# Patient Record
Sex: Female | Born: 2000
Health system: Southern US, Community
[De-identification: ages and names within clinical notes are randomized; demographics above are authoritative.]

## PROBLEM LIST (undated history)

## (undated) DIAGNOSIS — R569 Unspecified convulsions: Secondary | ICD-10-CM

## (undated) HISTORY — PX: MOUTH SURGERY: SHX715

## (undated) HISTORY — DX: Unspecified convulsions: R56.9

---
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
## 2020-01-30 ENCOUNTER — Other Ambulatory Visit (INDEPENDENT_AMBULATORY_CARE_PROVIDER_SITE_OTHER): Payer: Self-pay | Admitting: Pediatrics

## 2020-01-30 ENCOUNTER — Encounter (INDEPENDENT_AMBULATORY_CARE_PROVIDER_SITE_OTHER): Payer: Self-pay

## 2020-01-30 DIAGNOSIS — G40209 Localization-related (focal) (partial) symptomatic epilepsy and epileptic syndromes with complex partial seizures, not intractable, without status epilepticus: Secondary | ICD-10-CM

## 2020-01-30 NOTE — Telephone Encounter (Signed)
Please scan the orders and email them to [facility].

## 2020-02-01 ENCOUNTER — Other Ambulatory Visit: Payer: Self-pay | Admitting: Pediatrics

## 2020-02-01 DIAGNOSIS — G40909 Epilepsy, unspecified, not intractable, without status epilepticus: Secondary | ICD-10-CM | POA: Diagnosis not present

## 2020-02-05 ENCOUNTER — Encounter (INDEPENDENT_AMBULATORY_CARE_PROVIDER_SITE_OTHER): Payer: Self-pay

## 2020-02-05 LAB — LAMOTRIGINE LEVEL: Lamotrigine Lvl: 6.3 ug/mL (ref 4.0–18.0)

## 2020-04-28 ENCOUNTER — Encounter (INDEPENDENT_AMBULATORY_CARE_PROVIDER_SITE_OTHER): Payer: Self-pay | Admitting: Pediatrics

## 2020-04-28 ENCOUNTER — Ambulatory Visit (INDEPENDENT_AMBULATORY_CARE_PROVIDER_SITE_OTHER): Payer: BC Managed Care – PPO | Admitting: Pediatrics

## 2020-04-28 ENCOUNTER — Other Ambulatory Visit: Payer: Self-pay

## 2020-04-28 VITALS — BP 110/70 | HR 68 | Ht 70.5 in | Wt 177.0 lb

## 2020-04-28 DIAGNOSIS — G40209 Localization-related (focal) (partial) symptomatic epilepsy and epileptic syndromes with complex partial seizures, not intractable, without status epilepticus: Secondary | ICD-10-CM

## 2020-04-28 MED ORDER — LAMOTRIGINE ER 200 MG PO TB24: 1.0000 | ORAL_TABLET | Freq: Two times a day (BID) | ORAL | 5 refills | Status: DC

## 2020-04-28 NOTE — Progress Notes (Signed)
Patient: Denise Graves MRN: [id number] Sex: female DOB: 02-Apr-2001  Provider: Ellison Carwin, MD Location of Care: [facility]  Note type: Routine return visit  History of Present Illness: Referral Source: Anner Crete, MD History from: both parents, patient and [facility] chart Chief Complaint: Focal epilepsy  Denise Graves is a 19 y.o. female who was evaluated April 28, 2020 for the first time since October 30, 2019.  She has focal epilepsy with impairment of consciousness.  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  She has been growing and scaling much of the summer.  She has had no episodes of dj vu or altered mental status.  She completed her first year at [employer] but remains in [employer] major.  She was able to switch to generic brands of lamotrigine without having recurrent seizures.  Her health is good.  She is sleeping well.  She has been vaccinated for Covid with Jan J vaccine and her parents with [employer].  Review of Systems: A complete review of systems was remarkable for patient is here to be seen for focal epilepsy. Patient reports that she has not had any episdoes of deja vu orseizures. She states that she has no concerns for today. , all other systems reviewed and negative.  Past Medical History Diagnosis Date  . Seizures (HCC)    Hospitalizations: No., Head Injury: No., Nervous System Infections: No., Immunizations up to date: Yes.    Copied from prior chart notes Denise Graves had onset of seizures about 10 days before her first birthday. She had episodes of staring, stiffening, and twitching of her arms. She had two more episodes on October 01, 2001 and October 02, 2001. She had a normal CT scan and a normal EEG. MRI scan of the brain was performed and was normal October 13, 2001. I recommended treating her with Depakene. Her last seizure was September 14, 2003, some of her seizures were associated  with fever. She had a normal EEG. Her medication was tapered and discontinued in December, 2006. She remained seizure-free for only three months. Her last known seizure was August 26, 2006.  EEG on August 29, 2008, showed 2 to 3-second parieto-occipital bursts of to her sharply contoured slow wave activity. EEG on August 07, 2009, showed the same pattern. On August 19, 2010, she had frequent runs of 4 Hz occipitally predominant spike and slow wave discharge, this was seen again on September 09, 2011. She had diphasic sharply contoured slow wave activity, more prominent in the higher amplitude of the left hemisphere than the right that was similar in morphology and location. This precluded tapering and discontinuing her medication.  See history of recurrent seizures in February 24, 2018 note.  Routine EEG on June 23, 2017, that was normal. A 72-hour EEG that showed 2 episodes during sleep: One 26-second event and another 10-second event, a centrally predominant rhythmic delta-range activity in the first and sharply contoured slow-wave activity without a coherent field inthe other. She had no arousal. MRI of the brain on August 25, 2017, was normal.  Birth History 8 pounds infant born to a [redacted] week gestational age primigravida.  Gestation was complicated by deep vein thrombosis and was treated with Lovenox and alpha-fetoprotein that suggested the possibility of Down syndrome. Early induction of labor.  Normal spontaneous vertex vaginal delivery with normal Apgars.The patient had hyperbilirubinemia which peaked 24. Mother was breast-feeding. The patient was treated with double bilirubin  lights and did not require exchange transfusion. The patient went home with mother.  Behavior History  none  Surgical History Procedure Laterality Date  . MOUTH SURGERY    . STRABISMUS SURGERY  07/29/08  . TOENAIL EXCISION Right 07/2012   Family History family history includes Birth defects in her  cousin; Colon cancer in her maternal grandmother; Heart attack in her maternal grandfather; Lung cancer in her paternal grandmother; Seizures in her cousin and father. Family history is negative for migraines, intellectual disabilities, blindness, deafness, birth defects, chromosomal disorder, or autism.  Social History Socioeconomic History  . Marital status: Single  . Years of education:  45  . Highest education level:  Completed freshman year in college  Occupational History  . Not employed  Tobacco Use  . Smoking status: Never Smoker  . Smokeless tobacco: Never Used  Vaping Use  . Vaping Use: Never used  Substance and Sexual Activity  . Alcohol use: No  . Drug use: No  . 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    She lives with both parents. She has no siblings.    She enjoys rowing, crafting and reading books.    She attends [employer].   Allergies Allergen Reactions  . Other Rash    Crab, [employer]  . Mobic [Meloxicam]     Per pt's mom-mom highly allergic and would prefer her not to ever take  . Nutmeg Oil (Myristica Oil) Hives  . Pistachio Nut (Diagnostic) Itching and Rash   Physical Exam BP 110/70   Pulse 68   Ht 5' 10.5" (1.791 m)   Wt 177 lb (80.3 kg)   BMI 25.04 kg/m   General: alert, well developed, well nourished, in no acute distress, red hair, blue eyes, right handed Head: normocephalic, no dysmorphic features Ears, Nose and Throat: Otoscopic: tympanic membranes normal; pharynx: oropharynx is pink without exudates or tonsillar hypertrophy Neck: supple, full range of motion, no cranial or cervical bruits Respiratory: auscultation clear Cardiovascular: no murmurs, pulses are normal Musculoskeletal: no skeletal deformities or apparent scoliosis Skin: no rashes or neurocutaneous lesions  Neurologic Exam  Mental  Status: alert; oriented to person, place and year; knowledge is normal for age; language is normal Cranial Nerves: visual fields are full to double simultaneous stimuli; extraocular movements are full and conjugate; pupils are round reactive to light; funduscopic examination shows sharp disc margins with normal vessels; symmetric facial strength; midline tongue and uvula; air conduction is greater than bone conduction bilaterally Motor: Normal strength, tone and mass; good fine motor movements; no pronator drift Sensory: intact responses to cold, vibration, proprioception and stereognosis Coordination: good finger-to-nose, rapid repetitive alternating movements and finger apposition Gait and Station: normal gait and station: patient is able to walk on heels, toes and tandem without difficulty; balance is adequate; Romberg exam is negative; Gower response is negative Reflexes: symmetric and diminished bilaterally; no clonus; bilateral flexor plantar responses  Assessment 1.  Focal epilepsy with impairment of consciousness, G40.209.  Discussion I am pleased that Emmaline is stable.  There is no reason to change her current treatment.  She is taking a moderate dose of lamotrigine even though her drug level is 6.3 mcg/mL.  The peak drug level has been 10.5.  These are both solidly in the therapeutic range.  Plan Prescription was refilled for lamotrigine extended release 200  mg tablets 1 twice daily.  She will return to see me in 6 months for routine visit.  I will see her sooner based on clinical need.  I informed the family that as of June 19, 2021 I will retire and that we will need to find an adult neurologist for her prior to that transition.  I talked about a number of options that she has.  Because she is well controlled, compliant with the medication routine and otherwise entirely normal, we will have no trouble finding an appropriate provider for her..  Greater than 50% of 25-minute visit was  spent in counseling and coordination of care concerning her epilepsy and discussing future management of her condition.   Medication List   Accurate as of April 28, 2020 11:59 PM. If you have any questions, ask your nurse or doctor.      TAKE these medications   diclofenac 75 MG EC tablet Commonly known as: VOLTAREN Take 1 tablet (75 mg total) by mouth 2 (two) times daily as needed.   LamoTRIgine 200 MG Tb24 24 hour tablet Take 1 tablet (200 mg total) by mouth 2 (two) times daily.    The medication list was reviewed and reconciled. All changes or newly prescribed medications were explained.  A complete medication list was provided to the patient/caregiver.  Deetta Perla MD

## 2020-04-28 NOTE — Patient Instructions (Signed)
It was pleasure to see you today.  I am glad that Shaneque remains seizure-free.  There is no reason to change her lamotrigine.  We have talked about the transition in the upcoming year.  We will work to get you a physician who will meet your needs.  Good luck with your sophomore year and your rowing.  I would like to see you in 6 months.  Please let me know if there is any issue with your lamotrigine.

## 2020-07-01 ENCOUNTER — Encounter: Payer: Self-pay | Admitting: Podiatry

## 2020-07-01 ENCOUNTER — Ambulatory Visit (INDEPENDENT_AMBULATORY_CARE_PROVIDER_SITE_OTHER): Payer: BC Managed Care – PPO

## 2020-07-01 ENCOUNTER — Ambulatory Visit (INDEPENDENT_AMBULATORY_CARE_PROVIDER_SITE_OTHER): Payer: BC Managed Care – PPO | Admitting: Podiatry

## 2020-07-01 ENCOUNTER — Other Ambulatory Visit: Payer: Self-pay

## 2020-07-01 DIAGNOSIS — S9031XA Contusion of right foot, initial encounter: Secondary | ICD-10-CM | POA: Diagnosis not present

## 2020-07-02 NOTE — Progress Notes (Signed)
She presents today after having not seen her in a year with a chief complaint of pain to the dorsal aspect of her midfoot right.  States that she dropped a laptop on her foot sort of flat on top of her foot about 12 days ago.  States it was black and blue and swollen and initially bruised she is able to walk on it.  Objective: Vital signs are stable she is alert and oriented x3 after physical exam demonstrates normal vascular status neurologic status though she does have tenderness on palpation of the deep peroneal nerve and the extensor hallucis longus tendon.  Radiographs do not demonstrate any fractures.  Assessment: Contusion foot right.  Plan: Discussed appropriate shoe gear stretching exercise ice therapy sugar modifications contrast baths and staying off of it until it heals.  I did encourage icing after activities.  Also encouraged 600 mg of ibuprofen and a Tylenol prior to her workouts

## 2020-07-19 DIAGNOSIS — M7651 Patellar tendinitis, right knee: Secondary | ICD-10-CM | POA: Diagnosis not present

## 2020-08-10 DIAGNOSIS — H6690 Otitis media, unspecified, unspecified ear: Secondary | ICD-10-CM | POA: Diagnosis not present

## 2020-08-10 DIAGNOSIS — J069 Acute upper respiratory infection, unspecified: Secondary | ICD-10-CM | POA: Diagnosis not present

## 2020-09-06 DIAGNOSIS — H6691 Otitis media, unspecified, right ear: Secondary | ICD-10-CM | POA: Diagnosis not present

## 2020-09-17 DIAGNOSIS — H9190 Unspecified hearing loss, unspecified ear: Secondary | ICD-10-CM | POA: Diagnosis not present

## 2020-09-17 DIAGNOSIS — H6981 Other specified disorders of Eustachian tube, right ear: Secondary | ICD-10-CM | POA: Diagnosis not present

## 2020-10-23 ENCOUNTER — Telehealth (INDEPENDENT_AMBULATORY_CARE_PROVIDER_SITE_OTHER): Payer: Self-pay

## 2020-10-23 DIAGNOSIS — G40209 Localization-related (focal) (partial) symptomatic epilepsy and epileptic syndromes with complex partial seizures, not intractable, without status epilepticus: Secondary | ICD-10-CM

## 2020-10-23 MED ORDER — LAMOTRIGINE ER 200 MG PO TB24: 1.0000 | ORAL_TABLET | Freq: Two times a day (BID) | ORAL | 5 refills | Status: DC

## 2020-10-23 NOTE — Telephone Encounter (Signed)
Rx has been sent to the pharmacy

## 2020-10-31 ENCOUNTER — Ambulatory Visit (INDEPENDENT_AMBULATORY_CARE_PROVIDER_SITE_OTHER): Payer: BC Managed Care – PPO | Admitting: Pediatrics

## 2020-10-31 ENCOUNTER — Other Ambulatory Visit: Payer: Self-pay

## 2020-10-31 ENCOUNTER — Encounter (INDEPENDENT_AMBULATORY_CARE_PROVIDER_SITE_OTHER): Payer: Self-pay | Admitting: Pediatrics

## 2020-10-31 VITALS — BP 112/72 | HR 70 | Ht 70.5 in | Wt 173.2 lb

## 2020-10-31 DIAGNOSIS — G40209 Localization-related (focal) (partial) symptomatic epilepsy and epileptic syndromes with complex partial seizures, not intractable, without status epilepticus: Secondary | ICD-10-CM

## 2020-10-31 NOTE — Patient Instructions (Signed)
It was a pleasure to see you today.  We refilled prescription for lamotrigine for 6 months in early February.  I am glad that you got through Covid without getting very sick.  I am very excited about your attempt to row on the [employer] developmental level.  I look forward to seeing you in 6 months and at that time we have to decide which adult neurologist you will see.  Please let me know if there is anything I can do between now and when I see you.

## 2020-10-31 NOTE — Progress Notes (Signed)
Patient: Denise Graves MRN: [id number] Sex: female DOB: 2001-04-11  Provider: Ellison Carwin, MD Location of Care: [facility] Child [facility]  Note type: Routine return visit  History of Present Illness: Referral Source: Armandina Stammer, MD History from: patient, [facility] chart and mom and dad Chief Complaint: Epilepsy  Denise Graves is a 20 y.o. female who was evaluated October 31, 2020 for the first time since April 28, 2020.  She has focal epilepsy with impairment of consciousness.  This is manifested by symptoms of dj vu and altered mental status.  She takes and tolerates lamotrigine without side effects.  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  She is majoring in Occupational hygienist and has a GPA of 3.47.  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  She is joined a [employer].  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  She contracted Covid with her mother in early February despite the fact that both had been vaccinated and boosted.  Fortunately they have completely recovered.  Review of Systems: A complete review of systems was assessed and was negative except as noted above.  Past Medical History Diagnosis Date  . Seizures (HCC)    Hospitalizations: No., Head Injury: No., Nervous System Infections: No., Immunizations up to date: Yes.    Copied from prior chart notes [name] had onset of seizures about 10 days before her first birthday. She had episodes of staring, stiffening, and twitching of her arms. She had two more episodes on October 01, 2001 and October 02, 2001. She had a normal CT scan and a normal EEG. MRI scan of the brain was performed and was normal October 13, 2001. I recommended treating her with Depakene. Her last seizure was September 14, 2003, some of her  seizures were associated with fever. She had a normal EEG. Her medication was tapered and discontinued in December, 2006. She remained seizure-free for only three months. Her last known seizure was August 26, 2006.  EEG on August 29, 2008, showed 2 to 3-second parieto-occipital bursts of to her sharply contoured slow wave activity. EEG on August 07, 2009, showed the same pattern. On August 19, 2010, she had frequent runs of 4 Hz occipitally predominant spike and slow wave discharge, this was seen again on September 09, 2011. She had diphasic sharply contoured slow wave activity, more prominent in the higher amplitude of the left hemisphere than the right that was similar in morphology and location. This precluded tapering and discontinuing her medication.  See history of recurrent seizures in February 24, 2018 note.  Routine EEG on June 23, 2017, that was normal. A 72-hour EEG that showed 2 episodes during sleep: One 26-second event and another 10-second event, a centrally predominant rhythmic delta-range activity in the first and sharply contoured slow-wave activity without a coherent field inthe other. She had no arousal. MRI of the brain on August 25, 2017, was normal.  Birth History 8 pounds infant born to a [redacted] week gestational age primigravida.  Gestation was complicated by deep vein thrombosis and was treated with Lovenox and alpha-fetoprotein that suggested the possibility of Down syndrome. Early induction of labor.  Normal spontaneous vertex vaginal delivery with normal Apgars.The patient had hyperbilirubinemia which peaked 24. Mother was breast-feeding. The patient was treated with  double bilirubin lights and did not require exchange transfusion. The patient went home with mother.  Behavior History none  Surgical History Procedure Laterality Date  . MOUTH SURGERY    . STRABISMUS SURGERY  07/29/08  . TOENAIL EXCISION Right 07/2012   Family History family history  includes Birth defects in her cousin; Colon cancer in her maternal grandmother; Heart attack in her maternal grandfather; Lung cancer in her paternal grandmother; Seizures in her cousin and father. Family history is negative for migraines, intellectual disabilities, blindness, deafness, birth defects, chromosomal disorder, or autism.  Social History Socioeconomic History  . Marital status: Single  . Years of education:  31  . Highest education level:  College sophomore  Occupational History  . Not employed  Tobacco Use  . Smoking status: Never Smoker  . Smokeless tobacco: Never Used  Vaping Use  . Vaping Use: Never used  Substance and Sexual Activity  . Alcohol use: No  . Drug use: No  . 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    She lives with both parents. She has no siblings.    She enjoys rowing, crafting and reading books.    She attends [employer].   Allergies Allergen Reactions  . Other Rash    Crab, [employer]  . Mobic [Meloxicam]     Per pt's mom-mom highly allergic and would prefer her not to ever take  . Nutmeg Oil (Myristica Oil) Hives  . Pistachio Nut (Diagnostic) Itching and Rash   Physical Exam BP 112/72   Pulse 70   Ht 5' 10.5" (1.791 m)   Wt 173 lb 3.2 oz (78.6 kg)   BMI 24.50 kg/m   General: alert, well developed, well nourished, in no acute distress, red hair, blue eyes, right handed Head: normocephalic, no dysmorphic features Ears, Nose and Throat: Otoscopic: tympanic membranes normal; pharynx: oropharynx is pink without exudates or tonsillar hypertrophy Neck: supple, full range of motion, no cranial or cervical bruits Respiratory: auscultation clear Cardiovascular: no murmurs, pulses are normal Musculoskeletal: no skeletal deformities or apparent scoliosis Skin: no rashes or neurocutaneous lesions  Neurologic  Exam  Mental Status: alert; oriented to person, place and year; knowledge is normal for age; language is normal Cranial Nerves: visual fields are full to double simultaneous stimuli; extraocular movements are full and conjugate; pupils are round reactive to light; funduscopic examination shows sharp disc margins with normal vessels; symmetric facial strength; midline tongue and uvula; air conduction is greater than bone conduction bilaterally Motor: Normal strength, tone and mass; good fine motor movements; no pronator drift Sensory: intact responses to cold, vibration, proprioception and stereognosis Coordination: good finger-to-nose, rapid repetitive alternating movements and finger apposition Gait and Station: normal gait and station: patient is able to walk on heels, toes and tandem without difficulty; balance is adequate; Romberg exam is negative; Gower response is negative Reflexes: symmetric and diminished bilaterally; no clonus; bilateral flexor plantar responses  Assessment 1.  Focal epilepsy with impairment of consciousness, G40.209.  Discussion I am pleased that Jaritza is doing well physically and neurologically.  There is no reason to change her medication.  Plan I refilled her prescription for 6 months earlier this month.  She will return to see me in 6 months.  At that time we will need to arrange for an adult neurologist to continue providing care to her  after my retirement in June 19, 2021.   Medication List   Accurate as of October 31, 2020  4:06 PM. If you have any questions, ask your nurse or doctor.    LamoTRIgine 200 MG Tb24 24 hour tablet Take 1 tablet (200 mg total) by mouth 2 (two) times daily.    The medication list was reviewed and reconciled. All changes or newly prescribed medications were explained.  A complete medication list was provided to the patient/caregiver.  Deetta Perla MD

## 2021-01-16 ENCOUNTER — Encounter: Payer: Self-pay | Admitting: Family Medicine

## 2021-01-16 ENCOUNTER — Ambulatory Visit: Payer: Self-pay

## 2021-01-16 ENCOUNTER — Other Ambulatory Visit: Payer: Self-pay

## 2021-01-16 ENCOUNTER — Ambulatory Visit (INDEPENDENT_AMBULATORY_CARE_PROVIDER_SITE_OTHER): Payer: BC Managed Care – PPO | Admitting: Family Medicine

## 2021-01-16 DIAGNOSIS — M25511 Pain in right shoulder: Secondary | ICD-10-CM

## 2021-01-16 MED ORDER — CELECOXIB 100 MG PO CAPS
100.0000 mg | ORAL_CAPSULE | Freq: Two times a day (BID) | ORAL | 3 refills | Status: DC | PRN
Start: 2021-01-16 — End: 2021-04-22

## 2021-01-16 NOTE — Progress Notes (Signed)
Office Visit Note   Patient: Denise Graves           Date of Birth: [birth year]           MRN: [id number] Visit Date: 01/16/2021 Requested by: Armandina Stammer, MD [address] [place],  [place] [address] PCP: Armandina Stammer, MD  Subjective: Chief Complaint  Patient presents with  . Right Shoulder - Pain    Constant pain since February. NKI. Will wake her from sleep if she rolls on the right side. Decreased ROM due to pain.    HPI: She is here with right shoulder pain.  She is right-hand dominant.  Symptoms started in February, no definite injury.  She is a Environmental consultant.  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  With time her shoulder became more and more painful and now it does not move like the left 1.  Pain seems to be most intense on top of the shoulder but it radiates down toward the elbow at times.  She has tried ibuprofen and heat as well as ice but nothing seems to be helping.              ROS:   All other systems were reviewed and are negative.  Objective: Vital Signs: There were no vitals taken for this visit.  Physical Exam:  General:  Alert and oriented, in no acute distress. Pulm:  Breathing unlabored. Psy:  Normal mood, congruent affect. Skin: No rash. Right shoulder: She has pain at the extremes of range of motion.  She has slight stiffness with external rotation compared to the left.  There is some tenderness to palpation of the [facility] joint.  Isometric rotator cuff strength is 5/5 throughout but she does have pain with empty can test.  O'Brien's test is equivocal.  Imaging: XR Shoulder Right  Result Date: 01/16/2021 X-rays of the right shoulder reveal normal anatomic alignment, closed growth plates, no Bankart lesion or Hill-Sachs deformity, no soft tissue calcifications, no sign of stress fracture.   Assessment & Plan: 1.  Right shoulder pain, suspect  impingement.  Cannot rule out labrum tear. - Referral to [facility] physical therapy.  Celebrex as needed. -She has a 1 month camp in [place] for elite rowers in June.  If she is not significantly better prior to that, we might consider a one-time subacromial steroid injection prior to her trip.     Procedures: No procedures performed        PMFS History: Patient Active Problem List   Diagnosis Date Noted  . Acute dysfunction of both eustachian tubes 03/07/2018  . Esotropia 03/07/2018  . Sleep disturbance, unspecified 03/07/2018  . PCOS (polycystic ovarian syndrome) 12/20/2017  . Focal epilepsy with impairment of consciousness (HCC) 11/25/2017  . Encounter for long-term (current) use of other medications 08/13/2013  . S/P eye surgery, follow-up exam 04/24/2013   Past Medical History:  Diagnosis Date  . Seizures (HCC)     Family History  Problem Relation Age of Onset  . Seizures Father        Febrile Sz  . Colon cancer Maternal Grandmother        Died at age 30  . Seizures Cousin        Maternal 2nd Cousin  . Birth defects Cousin        2nd Cousin w an ill defined birth defect  . Heart attack  Maternal Grandfather   . Lung cancer Paternal Grandmother     Past Surgical History:  Procedure Laterality Date  . MOUTH SURGERY    . STRABISMUS SURGERY  07/29/08  . TOENAIL EXCISION Right 07/2012   Social History   Occupational History  . Not on file  Tobacco Use  . Smoking status: Never Smoker  . Smokeless tobacco: Never Used  Vaping Use  . Vaping Use: Never used  Substance and Sexual Activity  . Alcohol use: No  . Drug use: No  . Sexual activity: Never    Comment: VIRGIN

## 2021-01-22 ENCOUNTER — Encounter (INDEPENDENT_AMBULATORY_CARE_PROVIDER_SITE_OTHER): Payer: Self-pay

## 2021-02-02 ENCOUNTER — Telehealth: Payer: Self-pay | Admitting: *Deleted

## 2021-02-02 DIAGNOSIS — M25511 Pain in right shoulder: Secondary | ICD-10-CM

## 2021-02-02 NOTE — Telephone Encounter (Signed)
Done

## 2021-02-02 NOTE — Telephone Encounter (Signed)
Pt was in on 01/16/21 order was handwritten for Physical therapy to [facility] PT, but they do not accept her insurance, can you please put in an order in Epic so I can send her to our PT here. Mom would like for this to be done ASAP. thanks

## 2021-02-03 ENCOUNTER — Ambulatory Visit: Payer: BC Managed Care – PPO | Admitting: Physical Therapy

## 2021-02-10 ENCOUNTER — Encounter (INDEPENDENT_AMBULATORY_CARE_PROVIDER_SITE_OTHER): Payer: Self-pay

## 2021-02-23 DIAGNOSIS — H6691 Otitis media, unspecified, right ear: Secondary | ICD-10-CM | POA: Diagnosis not present

## 2021-02-24 ENCOUNTER — Encounter: Payer: Self-pay | Admitting: Family Medicine

## 2021-02-26 ENCOUNTER — Ambulatory Visit (INDEPENDENT_AMBULATORY_CARE_PROVIDER_SITE_OTHER): Payer: BC Managed Care – PPO | Admitting: Family Medicine

## 2021-02-26 DIAGNOSIS — M25511 Pain in right shoulder: Secondary | ICD-10-CM

## 2021-02-26 NOTE — Progress Notes (Signed)
   Office Visit Note   Patient: Denise Graves           Date of Birth: 15-Dec-2000           MRN: [id number] Visit Date: 02/26/2021 Requested by: Armandina Stammer, MD [address] [place],  [place] [address] PCP: Armandina Stammer, MD  Subjective: Chief Complaint  Patient presents with   Right Shoulder - Follow-up, Pain    Planned cortisone injection. Currently on antibiotic for ear infection.     HPI: She is here for follow-up right shoulder pain with presumed impingement.  She is much better with physical therapy but still having pain with circumduction of her shoulder.  She is getting ready to leave for a big rowing event, and is interested in trying a cortisone injection.              ROS:   All other systems were reviewed and are negative.  Objective: Vital Signs: There were no vitals taken for this visit.  Physical Exam:  General:  Alert and oriented, in no acute distress. Pulm:  Breathing unlabored. Psy:  Normal mood, congruent affect. Skin: No rash Right shoulder: Full range of motion, still sits with slouched posture.  Rotator cuff strength is 5/5 throughout.  AC crossover test is negative, no tenderness at the [facility] joint.  She is tender in the lateral subacromial space.  Imaging: No results found.  Assessment & Plan: Right shoulder impingement - Subacromial injection today.  Very good relief during the immediate anesthetic phase.  She will follow-up as needed.     Procedures: Right shoulder injection: After sterile prep with Betadine, injected 5 cc 1% lidocaine without epinephrine and 6 mg betamethasone from lateral approach.       PMFS History: Patient Active Problem List   Diagnosis Date Noted   Acute dysfunction of both eustachian tubes 03/07/2018   Esotropia 03/07/2018   Sleep disturbance, unspecified 03/07/2018   PCOS (polycystic ovarian syndrome) 12/20/2017   Focal epilepsy with impairment of consciousness (HCC) 11/25/2017   Encounter for long-term  (current) use of other medications 08/13/2013   S/P eye surgery, follow-up exam 04/24/2013   Past Medical History:  Diagnosis Date   Seizures (HCC)     Family History  Problem Relation Age of Onset   Seizures Father        Febrile Sz   Colon cancer Maternal Grandmother        Died at age 32   Seizures Cousin        Maternal 2nd Cousin   Birth defects Cousin        2nd Cousin w an ill defined birth defect   Heart attack Maternal Grandfather    Lung cancer Paternal Grandmother     Past Surgical History:  Procedure Laterality Date   MOUTH SURGERY     STRABISMUS SURGERY  07/29/08   TOENAIL EXCISION Right 07/2012   Social History   Occupational History   Not on file  Tobacco Use   Smoking status: Never   Smokeless tobacco: Never  Vaping Use   Vaping Use: Never used  Substance and Sexual Activity   Alcohol use: No   Drug use: No   Sexual activity: Never    Comment: VIRGIN

## 2021-03-11 ENCOUNTER — Telehealth (INDEPENDENT_AMBULATORY_CARE_PROVIDER_SITE_OTHER): Payer: Self-pay | Admitting: Pediatrics

## 2021-03-11 ENCOUNTER — Encounter: Payer: Self-pay | Admitting: Neurology

## 2021-03-11 DIAGNOSIS — G40209 Localization-related (focal) (partial) symptomatic epilepsy and epileptic syndromes with complex partial seizures, not intractable, without status epilepticus: Secondary | ICD-10-CM

## 2021-03-11 NOTE — Telephone Encounter (Signed)
Transfer of care requested to Dr. Patrcia Dolly.

## 2021-04-07 ENCOUNTER — Encounter (INDEPENDENT_AMBULATORY_CARE_PROVIDER_SITE_OTHER): Payer: Self-pay

## 2021-04-07 DIAGNOSIS — I517 Cardiomegaly: Secondary | ICD-10-CM

## 2021-04-09 NOTE — Telephone Encounter (Signed)
Consultation with [facility] has been requested

## 2021-04-22 ENCOUNTER — Ambulatory Visit (HOSPITAL_BASED_OUTPATIENT_CLINIC_OR_DEPARTMENT_OTHER): Payer: BC Managed Care – PPO | Admitting: Family Medicine

## 2021-04-22 ENCOUNTER — Encounter (HOSPITAL_BASED_OUTPATIENT_CLINIC_OR_DEPARTMENT_OTHER): Payer: Self-pay | Admitting: Family Medicine

## 2021-04-22 ENCOUNTER — Other Ambulatory Visit: Payer: Self-pay

## 2021-04-22 VITALS — BP 100/64 | HR 67 | Ht 72.0 in | Wt 180.6 lb

## 2021-04-22 DIAGNOSIS — L700 Acne vulgaris: Secondary | ICD-10-CM | POA: Diagnosis not present

## 2021-04-22 DIAGNOSIS — L720 Epidermal cyst: Secondary | ICD-10-CM | POA: Diagnosis not present

## 2021-04-22 DIAGNOSIS — G40209 Localization-related (focal) (partial) symptomatic epilepsy and epileptic syndromes with complex partial seizures, not intractable, without status epilepticus: Secondary | ICD-10-CM | POA: Diagnosis not present

## 2021-04-22 NOTE — Patient Instructions (Signed)
  Medication Instructions:  Your physician recommends that you continue on your current medications as directed. Please refer to the Current Medication list given to you today. --If you need a refill on any your medications before your next appointment, please call your pharmacy first. If no refills are authorized on file call the office.-- Follow-Up: Your next appointment:   Your physician recommends that you schedule a follow-up appointment in: 1 YEAR with Dr. de Cuba  Thanks for letting us be apart of your health journey!!  Primary Care and Sports Medicine   Dr. Raymond de Cuba   We encourage you to activate your patient portal called "MyChart".  Sign up information is provided on this After Visit Summary.  MyChart is used to connect with patients for Virtual Visits (Telemedicine).  Patients are able to view lab/test results, encounter notes, upcoming appointments, etc.  Non-urgent messages can be sent to your provider as well. To learn more about what you can do with MyChart, please visit --  [email or web address].    

## 2021-04-22 NOTE — Assessment & Plan Note (Signed)
Follows with neurology Currently on lamotrigine, tolerating well Continue current medications, continue follow-up with neurology, will be establishing with new neurologist in the fall

## 2021-04-22 NOTE — Progress Notes (Signed)
New Patient Office Visit  Subjective:  Patient ID: Denise Graves, female    DOB: 09-17-2001  Age: 20 y.o. MRN: [id number]  CC:  Chief Complaint  Patient presents with   Establish Care    Prior PCP [facility] - Dr. Carmon Ginsberg. Patient has no concerns or complaints today    HPI Denise Graves is a 20 year old female presenting to establish in clinic.  She reports past medical history of epilepsy.  She is accompanied today by her mother.  They do not have any acute concerns at this time.  Epilepsy: Has followed with neurology since she was 79 months old.  Current neurologist will be retiring this month, has arranged to establish with new neurologist in the fall.  Currently managed on lamotrigine 200 mg twice a day.  Has been on this medication for about 4 years now.  Previously was on Depakote until she was about 20 years old, and then was off medication until 51.  No current issues related to medication.  Medication was primarily refilled through neurology.  Does report history of intermittent ear infections that she has had issues with in the past.  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  She will be a junior this upcoming school year.  Does not have any current plans for after school, likely will pursue masters degree.  She is involved with rowing, aspirations to be on the [place] rowing team.  Past Medical History:  Diagnosis Date   Seizures [facility])     Past Surgical History:  Procedure Laterality Date   MOUTH SURGERY     STRABISMUS SURGERY  07/29/08   TOENAIL EXCISION Right 07/2012    Family History  Problem Relation Age of Onset   Seizures Father        Febrile Sz   Colon cancer Maternal Grandmother        Died at age 50   Seizures Cousin        Maternal 2nd Cousin   Birth defects Cousin        2nd Cousin w an ill defined birth defect   Heart attack Maternal Grandfather    Lung cancer Paternal  Grandmother     Social History   Socioeconomic History   Marital status: Single    Spouse name: Not on file   Number of children: Not on file   Years of education: Not on file   Highest education level: Not on file  Occupational History   Not on file  Tobacco Use   Smoking status: Never   Smokeless tobacco: Never  Vaping Use   Vaping Use: Never used  Substance and Sexual Activity   Alcohol use: No   Drug use: No   Sexual activity: Never    Comment: VIRGIN  Other Topics Concern   Not on file  Social History Narrative   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   She lives with both parents. She has no siblings.   She enjoys rowing, crafting and reading books.   She attends [employer].   Social Determinants of Health   Financial Resource Strain: Not on file  Food Insecurity: Not on file  Transportation Needs: Not on file  Physical Activity: Not on file  Stress: Not on file  Social Connections: Not on file  Intimate Partner Violence: Not on file    Objective:   Today's  Vitals: BP 100/64   Pulse 67   Ht 6' (1.829 m)   Wt 180 lb 9.6 oz (81.9 kg)   LMP 03/13/2021   SpO2 96%   BMI 24.49 kg/m   Physical Exam  Pleasant 20 year old female in no acute distress Cardiovascular exam with regular rate and rhythm, no murmurs appreciated Lungs clear to auscultation bilaterally  Assessment & Plan:   Problem List Items Addressed This Visit       Nervous and Auditory   Focal epilepsy with impairment of consciousness (HCC) - Primary    Follows with neurology Currently on lamotrigine, tolerating well Continue current medications, continue follow-up with neurology, will be establishing with new neurologist in the fall        Outpatient Encounter Medications as of 04/22/2021  Medication Sig   LamoTRIgine 200 MG TB24 24 hour tablet Take 1 tablet (200 mg total) by mouth 2 (two) times daily.   [DISCONTINUED] cefdinir  (OMNICEF) 300 MG capsule Take by mouth. (Patient not taking: Reported on 04/22/2021)   [DISCONTINUED] celecoxib (CELEBREX) 100 MG capsule Take 1 capsule (100 mg total) by mouth 2 (two) times daily as needed. (Patient not taking: Reported on 04/22/2021)   No facility-administered encounter medications on file as of 04/22/2021.    Follow-up: Return in about 1 year (around 04/22/2022) for Follow Up.  Plan to complete CPE at follow-up appointment about 1 year or sooner as needed should any issues arise.  Dann Ventress J De Peru, MD

## 2021-04-23 ENCOUNTER — Encounter: Payer: Self-pay | Admitting: Podiatry

## 2021-04-23 ENCOUNTER — Ambulatory Visit (INDEPENDENT_AMBULATORY_CARE_PROVIDER_SITE_OTHER): Payer: BC Managed Care – PPO | Admitting: Podiatry

## 2021-04-23 DIAGNOSIS — M722 Plantar fascial fibromatosis: Secondary | ICD-10-CM

## 2021-04-23 NOTE — Progress Notes (Signed)
Patient presents today with her mother for follow up plantar fasciitis left foot.   We were running behind schedule today and she had another appointment she needed to get to. Patient states she has had intermittent heel pain for years and questioning if she could be a candidate for EPAT therapy.   I did discuss EPAT treatment and protocol with her and her mom. She is currently getting ready to start back college and is on the rowing team, with rowing practices 4 hours per day. I did caution her on limitations and increased pain, with the possibility of having to wear a walking boot for pain control.   Her mom and her will think this over and be in touch on how they would like to proceed.

## 2021-04-24 ENCOUNTER — Telehealth: Payer: Self-pay | Admitting: Podiatry

## 2021-04-29 ENCOUNTER — Ambulatory Visit (INDEPENDENT_AMBULATORY_CARE_PROVIDER_SITE_OTHER): Payer: Self-pay

## 2021-04-29 ENCOUNTER — Other Ambulatory Visit: Payer: Self-pay

## 2021-04-29 DIAGNOSIS — B351 Tinea unguium: Secondary | ICD-10-CM

## 2021-04-29 DIAGNOSIS — M722 Plantar fascial fibromatosis: Secondary | ICD-10-CM

## 2021-04-29 NOTE — Progress Notes (Signed)
Patient presents for the 1st EPAT treatment today with complaint of left heel pain . Diagnosed with plantar fasciitis by Dr. Al Corpus. This has been ongoing for several months. The patient has tried ice, stretching, NSAIDS and supportive shoe gear with no long term relief.   Most of the pain is located in the center of the heel .  ESWT administered and tolerated well.Treatment settings initiated at:   Energy: 15  Ended treatment session today with 3000 shocks at the following settings:   Energy: 15  Frequency: 6.0  Joules: 14.72  Short CAM boot was dispensed at this visit.   Reviewed post EPAT instructions. Advised to avoid ice and NSAIDs throughout the treatment process and to utilize boot or supportive shoes for at least the next 3 days.  Follow up for 2nd treatment in 1 week.

## 2021-04-29 NOTE — Patient Instructions (Signed)

## 2021-05-01 DIAGNOSIS — R569 Unspecified convulsions: Secondary | ICD-10-CM | POA: Insufficient documentation

## 2021-05-06 ENCOUNTER — Encounter: Payer: Self-pay | Admitting: Podiatry

## 2021-05-06 ENCOUNTER — Other Ambulatory Visit: Payer: Self-pay

## 2021-05-06 ENCOUNTER — Telehealth (INDEPENDENT_AMBULATORY_CARE_PROVIDER_SITE_OTHER): Payer: BC Managed Care – PPO | Admitting: Pediatrics

## 2021-05-06 ENCOUNTER — Ambulatory Visit (INDEPENDENT_AMBULATORY_CARE_PROVIDER_SITE_OTHER): Payer: Self-pay

## 2021-05-06 DIAGNOSIS — M722 Plantar fascial fibromatosis: Secondary | ICD-10-CM

## 2021-05-06 DIAGNOSIS — B351 Tinea unguium: Secondary | ICD-10-CM

## 2021-05-06 NOTE — Progress Notes (Signed)
Patient presents for the 2nd EPAT treatment today with complaint of left heel pain . Diagnosed with plantar fasciitis by Dr. Al Corpus. This has been ongoing for several months. The patient has tried ice, stretching, NSAIDS and supportive shoe gear with no long term relief.   Most of the pain is located in the center of the heel .  ESWT administered and tolerated well.Treatment settings initiated at:   Energy: 20  Ended treatment session today with 3000 shocks at the following settings:   Energy: 20  Frequency: 5.0  Joules: 19.62  Short CAM boot was dispensed at this visit.   Reviewed post EPAT instructions. Advised to avoid ice and NSAIDs throughout the treatment process and to utilize boot or supportive shoes for at least the next 3 days.  Follow up for 3rd treatment in 1 week.

## 2021-05-07 ENCOUNTER — Ambulatory Visit (INDEPENDENT_AMBULATORY_CARE_PROVIDER_SITE_OTHER): Payer: BC Managed Care – PPO

## 2021-05-07 ENCOUNTER — Ambulatory Visit (INDEPENDENT_AMBULATORY_CARE_PROVIDER_SITE_OTHER): Payer: BC Managed Care – PPO | Admitting: Cardiology

## 2021-05-07 ENCOUNTER — Encounter: Payer: Self-pay | Admitting: Cardiology

## 2021-05-07 ENCOUNTER — Encounter: Payer: Self-pay | Admitting: Podiatry

## 2021-05-07 VITALS — BP 110/68 | HR 70 | Ht 72.0 in | Wt 180.0 lb

## 2021-05-07 DIAGNOSIS — R002 Palpitations: Secondary | ICD-10-CM | POA: Diagnosis not present

## 2021-05-07 NOTE — Patient Instructions (Signed)
Medication Instructions:  Your physician recommends that you continue on your current medications as directed. Please refer to the Current Medication list given to you today.  *If you need a refill on your cardiac medications before your next appointment, please call your pharmacy*   Lab Work: None If you have labs (blood work) drawn today and your tests are completely normal, you will receive your results only by: MyChart Message (if you have MyChart) OR A paper copy in the mail If you have any lab test that is abnormal or we need to change your treatment, we will call you to review the results.   Testing/Procedures: Your physician has requested that you have an echocardiogram. Echocardiography is a painless test that uses sound waves to create images of your heart. It provides your doctor with information about the size and shape of your heart and how well your heart's chambers and valves are working. This procedure takes approximately one hour. There are no restrictions for this procedure.  A zio monitor was ordered today. It will remain on for 14 days. You will then return monitor and event diary in provided box. It takes 1-2 weeks for report to be downloaded and returned to [place]. We will call you with the results. If monitor falls off or has orange flashing light, please call Zio for further instructions.     Follow-Up: At [facility], you and your health needs are our priority.  As part of our continuing mission to provide you with exceptional heart care, we have created designated Provider Care Teams.  These Care Teams include your primary Cardiologist (physician) and Advanced Practice Providers (APPs -  Physician Assistants and Nurse Practitioners) who all work together to provide you with the care you need, when you need it.  We recommend signing up for the patient portal called "MyChart".  Sign up information is provided on this After Visit Summary.  MyChart is used to connect with  patients for Virtual Visits (Telemedicine).  Patients are able to view lab/test results, encounter notes, upcoming appointments, etc.  Non-urgent messages can be sent to your provider as well.   To learn more about what you can do with MyChart, go to [email or web address].    Your next appointment:   As needed  The format for your next appointment:   In Person  Provider:   Elease Hashimoto - Thomasene Ripple, DO [address], [place], [place] [address]    Other Instructions ZIO  WHY IS MY DOCTOR PRESCRIBING ZIO? The Zio system is proven and trusted by physicians to detect and diagnose irregular heart rhythms -- and has been prescribed to hundreds of thousands of patients.  The FDA has cleared the Zio system to monitor for many different kinds of irregular heart rhythms. In a study, physicians were able to reach a diagnosis 90% of the time with the Zio system1.  You can wear the Zio monitor -- a small, discreet, comfortable patch -- during your normal day-to-day activity, including while you sleep, shower, and exercise, while it records every single heartbeat for analysis.  1Barrett, P., et al. Comparison of 24 Hour Holter Monitoring Versus 14 Day Novel Adhesive Patch Electrocardiographic Monitoring. American Journal of Medicine, 2014.  ZIO VS. HOLTER MONITORING The Zio monitor can be comfortably worn for up to 14 days. Holter monitors can be worn for 24 to 48 hours, limiting the time to record any irregular heart rhythms you may have. Zio is able to capture data for the 51% of  patients who have their first symptom-triggered arrhythmia after 48 hours.1  LIVE WITHOUT RESTRICTIONS The Zio ambulatory cardiac monitor is a small, unobtrusive, and water-resistant patch--you might even forget you're wearing it. The Zio monitor records and stores every beat of your heart, whether you're sleeping, working out, or showering. Remove and mail back after wearing for 14 days.

## 2021-05-07 NOTE — Progress Notes (Signed)
Cardiology Office Note:    Date:  05/08/2021   ID:  Denise MewsSarah Graves, DOB 07/04/2001, MRN [id number]  PCP:  de [place]uba, Raymond J, MD  Cardiologist:  Thomasene RippleKardie Tobb, DO  Electrophysiologist:  None   Referring MD: Deetta PerlaHickling, William H, MD    History of Present Illness:    Denise Graves is a 20 y.o. 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 PMHx includes well-controlled seizure disorder.  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 She rows twice daily for ~3 hrs at a time, five days per week. She has an apple watch and also wears a heart rate band when rowing. Her coach was concerned because her HR only reaches the 160s during maximum effort long rows (ie 5K). Her coach felt it should be at least 170 at that level of exertion.  She denies palpitations, chest pain, shortness of breath, dizziness, lightheadedness, syncope, or other symptoms. No family history of sudden or premature death. Only cardiac history in the family is paternal grandfather with CAD at an older age.  Past Medical History:  Diagnosis Date   Seizures [facility](HCC)     Past Surgical History:  Procedure Laterality Date   MOUTH SURGERY     STRABISMUS SURGERY  07/29/08   TOENAIL EXCISION Right 07/2012    Current Medications: Current Meds  Medication Sig   cetirizine (ZYRTEC) 10 MG tablet Take 10 mg by mouth daily.   tretinoin (RETIN-A) 0.1 % cream daily.   [DISCONTINUED] LamoTRIgine 200 MG TB24 24 hour tablet Take 1 tablet (200 mg total) by mouth 2 (two) times daily.     Allergies:   Other, Pumpkin seed oil, Mobic [meloxicam], Nutmeg oil (myristica oil), and Pistachio nut (diagnostic)   Social History   Socioeconomic History   Marital status: Single    Spouse name: Not on file   Number of children: Not on file   Years of education: Not on file   Highest education level: Not on file  Occupational History   Not on file  Tobacco Use   Smoking  status: Never   Smokeless tobacco: Never  Vaping Use   Vaping Use: Never used  Substance and Sexual Activity   Alcohol use: No   Drug use: No   Sexual activity: Never    Comment: [place]  Other Topics Concern   Not on file  Social History Narrative   Denise SagoSarah is a high [employer] graduate.   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   She lives with both parents. She has no siblings.   She enjoys rowing, crafting and reading books.   She attends [employer].   Social Determinants of Health   Financial Resource Strain: Not on file  Food Insecurity: Not on file  Transportation Needs: Not on file  Physical Activity: Not on file  Stress: Not on file  Social Connections: Not on file     Family History: The patient's family history includes Birth defects in her cousin; Colon cancer in her maternal grandmother; Heart attack in her maternal grandfather; Lung cancer in her paternal grandmother; Seizures in her cousin and father.  ROS:   Review of Systems  Constitution: Negative for decreased appetite, fever and weight gain.  HENT: Negative for congestion, ear discharge, hoarse voice and sore throat.   Eyes: Negative for discharge, redness, vision loss in right eye and visual halos.  Cardiovascular: Negative for chest pain, dyspnea  on exertion, leg swelling, orthopnea and palpitations.  Respiratory: Negative for cough, hemoptysis, shortness of breath and snoring.   Endocrine: Negative for heat intolerance and polyphagia.  Hematologic/Lymphatic: Negative for bleeding problem. Does not bruise/bleed easily.  Skin: Negative for flushing, nail changes, rash and suspicious lesions.  Musculoskeletal: Negative for arthritis, joint pain, muscle cramps, myalgias, neck pain and stiffness.  Gastrointestinal: Negative for abdominal pain, bowel incontinence, diarrhea and excessive appetite.  Genitourinary: Negative for decreased libido, genital sores and incomplete emptying.  Neurological:  Negative for brief paralysis, focal weakness, headaches and loss of balance.  Psychiatric/Behavioral: Negative for altered mental status, depression and suicidal ideas.  Allergic/Immunologic: Negative for HIV exposure and persistent infections.    EKGs/Labs/Other Studies Reviewed:    The following studies were reviewed today:  EKG:  The ekg ordered today demonstrates normal sinus rhythm  Recent Labs: No results found for requested labs within last 8760 hours.  Recent Lipid Panel    Component Value Date/Time   CHOL 156 03/28/2018 1700   TRIG 85 03/28/2018 1700   HDL 41 (L) 03/28/2018 1700   CHOLHDL 3.8 03/28/2018 1700   LDLCALC 97 03/28/2018 1700    Physical Exam:    VS:  BP 110/68 (BP Location: Left Arm, Patient Position: Sitting, Cuff Size: Normal)   Pulse 70   Ht 6' (1.829 m)   Wt 180 lb (81.6 kg)   SpO2 97%   BMI 24.41 kg/m     Wt Readings from Last 3 Encounters:  05/08/21 181 lb 9.6 oz (82.4 kg)  05/07/21 180 lb (81.6 kg)  04/22/21 180 lb 9.6 oz (81.9 kg)     GEN: Well nourished, well developed in no acute distress HEENT: Normal NECK: No JVD; No carotid bruits LYMPHATICS: No lymphadenopathy CARDIAC: S1S2 noted,RRR, no murmurs, rubs, gallops RESPIRATORY:  Clear to auscultation without rales, wheezing or rhonchi  ABDOMEN: Soft, non-tender, non-distended, +bowel sounds, no guarding. EXTREMITIES: No edema, No cyanosis, no clubbing MUSCULOSKELETAL:  No deformity  SKIN: Warm and dry NEUROLOGIC:  Alert and oriented x 3, non-focal PSYCHIATRIC:  Normal affect, good insight  ASSESSMENT:    1. 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 While this is most likely a reflection of her physical conditioning and is likely physiologic in nature, all parties involved (patient, parents, coach, her neurologist) felt it would be prudent to evaluate her heart as she is  training at such a high level.  We will obtain echo to rule out any structural abnormality and zio monitor to better assess her heart rate/rhythm while exercising.  The patient is in agreement with the above plan. The patient left the office in stable condition.  The patient will follow up as needed.   Medication Adjustments/Labs and Tests Ordered: Current medicines are reviewed at length with the patient today.  Concerns regarding medicines are outlined above.  Orders Placed This Encounter  Procedures   LONG TERM MONITOR (3-14 DAYS)   EKG 12-Lead   ECHOCARDIOGRAM COMPLETE   No orders of the defined types were placed in this encounter.   Patient Instructions  Medication Instructions:  Your physician recommends that you continue on your current medications as directed. Please refer to the Current Medication list given to you today.  *If you need a refill on your cardiac medications before your next appointment, please call your pharmacy*   Lab Work: None If you have labs (blood  work) drawn today and your tests are completely normal, you will receive your results only by: MyChart Message (if you have MyChart) OR A paper copy in the mail If you have any lab test that is abnormal or we need to change your treatment, we will call you to review the results.   Testing/Procedures: Your physician has requested that you have an echocardiogram. Echocardiography is a painless test that uses sound waves to create images of your heart. It provides your doctor with information about the size and shape of your heart and how well your heart's chambers and valves are working. This procedure takes approximately one hour. There are no restrictions for this procedure.  A zio monitor was ordered today. It will remain on for 14 days. You will then return monitor and event diary in provided box. It takes 1-2 weeks for report to be downloaded and returned to [place]. We will call you with the results. If  monitor falls off or has orange flashing light, please call Zio for further instructions.     Follow-Up: At [facility], you and your health needs are our priority.  As part of our continuing mission to provide you with exceptional heart care, we have created designated Provider Care Teams.  These Care Teams include your primary Cardiologist (physician) and Advanced Practice Providers (APPs -  Physician Assistants and Nurse Practitioners) who all work together to provide you with the care you need, when you need it.  We recommend signing up for the patient portal called "MyChart".  Sign up information is provided on this After Visit Summary.  MyChart is used to connect with patients for Virtual Visits (Telemedicine).  Patients are able to view lab/test results, encounter notes, upcoming appointments, etc.  Non-urgent messages can be sent to your provider as well.   To learn more about what you can do with MyChart, go to [email or web address].    Your next appointment:   As needed  The format for your next appointment:   In Person  Provider:   Elease Hashimoto - Thomasene Ripple, DO [address], [place], [place] [address]    Other Instructions ZIO  WHY IS MY DOCTOR PRESCRIBING ZIO? The Zio system is proven and trusted by physicians to detect and diagnose irregular heart rhythms -- and has been prescribed to hundreds of thousands of patients.  The FDA has cleared the Zio system to monitor for many different kinds of irregular heart rhythms. In a study, physicians were able to reach a diagnosis 90% of the time with the Zio system1.  You can wear the Zio monitor -- a small, discreet, comfortable patch -- during your normal day-to-day activity, including while you sleep, shower, and exercise, while it records every single heartbeat for analysis.  1Barrett, P., et al. Comparison of 24 Hour Holter Monitoring Versus 14 Day Novel Adhesive Patch Electrocardiographic Monitoring. American  Journal of Medicine, 2014.  ZIO VS. HOLTER MONITORING The Zio monitor can be comfortably worn for up to 14 days. Holter monitors can be worn for 24 to 48 hours, limiting the time to record any irregular heart rhythms you may have. Zio is able to capture data for the 51% of patients who have their first symptom-triggered arrhythmia after 48 hours.1  LIVE WITHOUT RESTRICTIONS The Zio ambulatory cardiac monitor is a small, unobtrusive, and water-resistant patch--you might even forget you're wearing it. The Zio monitor records and stores every beat of your heart, whether you're sleeping, working out, or showering. Remove and mail  back after wearing for 14 days.     Adopting a Healthy Lifestyle.  Know what a healthy weight is for you (roughly BMI <25) and aim to maintain this   Aim for 7+ servings of fruits and vegetables daily   65-80+ fluid ounces of water or unsweet tea for healthy kidneys   Limit to max 1 drink of alcohol per day; avoid smoking/tobacco   Limit animal fats in diet for cholesterol and heart health - choose grass fed whenever available   Avoid highly processed foods, and foods high in saturated/trans fats   Aim for low stress - take time to unwind and care for your mental health   Aim for 150 min of moderate intensity exercise weekly for heart health, and weights twice weekly for bone health   Aim for 7-9 hours of sleep daily   When it comes to diets, agreement about the perfect plan isnt easy to find, even among the experts. Experts at the [facility] [employer] developed an idea known as the Healthy Eating Plate. Just imagine a plate divided into logical, healthy portions.   The emphasis is on diet quality:   Load up on vegetables and fruits - one-half of your plate: Aim for color and variety, and remember that potatoes dont count.   Go for whole grains - one-quarter of your plate: Whole wheat, barley, wheat berries, quinoa, oats, brown rice, and foods  made with them. If you want pasta, go with whole wheat pasta.   Protein power - one-quarter of your plate: Fish, chicken, beans, and nuts are all healthy, versatile protein sources. Limit red meat.   The diet, however, does go beyond the plate, offering a few other suggestions.   Use healthy plant oils, such as olive, canola, soy, corn, sunflower and peanut. Check the labels, and avoid partially hydrogenated oil, which have unhealthy trans fats.   If youre thirsty, drink water. Coffee and tea are good in moderation, but skip sugary drinks and limit milk and dairy products to one or two daily servings.   The type of carbohydrate in the diet is more important than the amount. Some sources of carbohydrates, such as vegetables, fruits, whole grains, and beans-are healthier than others.   Finally, stay active  Signed, Thomasene Ripple, DO  05/08/2021 9:11 PM    [facility]

## 2021-05-08 ENCOUNTER — Ambulatory Visit (INDEPENDENT_AMBULATORY_CARE_PROVIDER_SITE_OTHER): Payer: BC Managed Care – PPO | Admitting: Pediatrics

## 2021-05-08 ENCOUNTER — Encounter (INDEPENDENT_AMBULATORY_CARE_PROVIDER_SITE_OTHER): Payer: Self-pay | Admitting: Pediatrics

## 2021-05-08 ENCOUNTER — Other Ambulatory Visit: Payer: Self-pay

## 2021-05-08 DIAGNOSIS — G40209 Localization-related (focal) (partial) symptomatic epilepsy and epileptic syndromes with complex partial seizures, not intractable, without status epilepticus: Secondary | ICD-10-CM

## 2021-05-08 MED ORDER — LAMOTRIGINE ER 200 MG PO TB24: 1.0000 | ORAL_TABLET | Freq: Two times a day (BID) | ORAL | 5 refills | Status: DC

## 2021-05-08 NOTE — Patient Instructions (Addendum)
Denise Graves, it is been a pleasure and a privilege to care for you since you were an infant.  Your parents have been just such strong advocates for you over the years.  Its been a thrill to what she grow to adulthood and to realize what an extraordinary and fine woman you are and will be.  I think you will like Dr. Karel Jarvis although our relationship will always be special and I will always care you and the parents in my heart.  Good luck in your upcoming year and with your rowing.

## 2021-05-08 NOTE — Progress Notes (Signed)
Patient: Denise Graves MRN: [id number] Sex: female DOB: 06-Sep-2001  Provider: Ellison Carwin, MD Location of Care: [facility]  Note type: Routine return visit  History of Present Illness: Referral Source: Armandina Stammer, MD History from: both parents, patient, and [facility] chart Chief Complaint: Epilepsy  [name] is a 20 y.o. female who was evaluated May 07, 2021 for the first time since October 31, 2020.  She has focal epilepsy with impairment of consciousness which was manifested by symptoms of dj vu and altered mental status.  She was placed on lamotrigine which was escalated with complete control of her seizures and no side effects.  She is entering her junior year at [employer].  She majors in Occupational hygienist with an excellent GPA.  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  She spent much of the summer in [place] with the [employer] developmental team.  She joined a [employer].  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  Her general health is good.  She and her mother contracted COVID in early February despite the fact that she had been vaccinated with a J&J.  I think that she should have booster but suggested that she wait until the omicron specific mRNA vaccine is available in mid-September.  She will be seen by Dr. Patrcia Dolly in transfer care in September.  Differential diagnosis with her episodes of unresponsiveness led to a cardiology work-up that event included heart monitor and will include echocardiogram.  I expect the tests to be negative.  She recently developed Planter fasciitis and is receiving treatment for that.  Review of Systems: A complete review of systems was remarkable for patient is here to be seen for a follow up. She reports no seizures since last visit. She has no concerns at this time, all other systems reviewed and  negative.  Past Medical History Diagnosis Date   Seizures (HCC)    Hospitalizations: No., Head Injury: No., Nervous System Infections: No., Immunizations up to date: Yes.    Copied from prior chart notes Melicia had onset of seizures about 10 days before her first birthday.  She had episodes of staring, stiffening, and twitching of her arms.  She had two more episodes on October 01, 2001 and October 02, 2001.  She had a normal CT scan and a normal EEG.  MRI scan of the brain was performed and was normal October 13, 2001.  I recommended treating her with Depakene.  Her last seizure was September 14, 2003, some of her seizures were associated with fever.  She had a normal EEG.  Her medication was tapered and discontinued in December, 2006.  She remained seizure-free for only three months.  Her last known seizure was August 26, 2006.   EEG on August 29, 2008, showed 2 to 3-second parieto-occipital bursts of two Hz sharply contoured slow wave activity. EEG on August 07, 2009, showed the same pattern. On August 19, 2010, she had frequent runs of 4 Hz occipitally predominant spike and slow wave discharge, this was seen again on September 09, 2011. She had diphasic sharply contoured slow wave activity, more prominent in the higher amplitude of the left hemisphere than the right that was similar in morphology and location. This precluded tapering and discontinuing her medication.   In the spring of 2018, she had an onset of episodes of feeling of warmth in her body, dull  headache, and deja vu as if she had an experienced an activity before even though she knew she had not.  The episodes were intermittent and stereotyped and described in detail in the note of August 04, 2017.   Routine EEG on June 23, 2017, that was normal.  A 72-hour EEG that showed 2 episodes during sleep:  One 26-second event and another 10-second event, a centrally predominant rhythmic delta-range activity in the first and sharply  contoured slow-wave activity without a coherent field in the other.  She had no arousal.  MRI of the brain on August 25, 2017, was normal.  The episodes began to increase in frequency after September 09, 2017.  There were 6 that were similar with a feeling of warmth in her lips, mouth, and jaw, a bad taste in her mouth, feeling hot and sweaty, and heart racing.  Her mother witnessed her trying to speak as if her mouth were full or slightly chewing on her words.  Following that, she had a bowel movement within minutes and a slight headache.  She was completely alert during these episodes.     Finally, on November 23, 2017 the family was in [place], [place].  She had an episode that began just as the others had, but she then had mastication, clenched her hands, eyes were open, but she was unable to communicate and for the first time unable to respond.  She had diaphoresis and spots of flushing on her chest.  This lasted for a couple of minutes, but she clearly was confused for at least a minute or two and then tired.      She had another complex partial seizure the same day that began as a simple partial seizure.  It was difficult to separate ictal behavior from postictal behavior.  She was seen urgently on November 25, 2017 plans were made to start lamotrigine and to titrated over several weeks.  After increasing lamotrigine to 200 mg twice daily, her seizures are in complete control.   Birth History 8 pounds infant born to a [redacted] week gestational age primigravida.  Gestation was complicated by deep vein thrombosis and was treated with Lovenox and alpha-fetoprotein that suggested the possibility of Down syndrome. Early induction of labor.  Normal spontaneous vertex vaginal delivery with normal Apgars.The patient had hyperbilirubinemia which peaked 24.  Mother was breast-feeding. The patient was treated with double bilirubin lights and did not require exchange transfusion. The patient went home with mother.     Behavior History none   Surgical History Procedure Laterality Date   MOUTH SURGERY     STRABISMUS SURGERY  07/29/08   TOENAIL EXCISION Right 07/2012   Family History family history includes Birth defects in her cousin; Colon cancer in her maternal grandmother; Heart attack in her maternal grandfather; Lung cancer in her paternal grandmother; Seizures in her cousin and father. Family history is negative for migraines, intellectual disabilities, blindness, deafness, birth defects, chromosomal disorder, or autism.  Social History Socioeconomic History   Marital status: Single   Years of education: 15   Highest education level: Completed sophomore year in college  Occupational History   Not currently employed  Tobacco Use   Smoking status: Never   Smokeless tobacco: Never  Vaping Use   Vaping Use: Never used  Substance and Sexual Activity   Alcohol use: No   Drug use: No   Sexual activity: Never    Comment: VIRGIN  Social History Narrative   Albirta is a high  school graduate.   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   She lives with both parents. She has no siblings.   She enjoys rowing, crafting and reading books.   She attends [employer].   Allergies Allergen Reactions   Other Rash    Crab, Atlantic White Fish   Pumpkin [employer] Hives and Nausea And Vomiting   Mobic [Meloxicam]     Per pt's mom-mom highly allergic and would prefer her not to ever take   Nutmeg Oil (Myristica Oil) Hives   Pistachio Nut (Diagnostic) Itching and Rash   Physical Exam BP 116/72   Pulse 64   Ht 5' 10.5" (1.791 m)   Wt 181 lb 9.6 oz (82.4 kg)   BMI 25.69 kg/m   General: alert, well developed, well nourished, in no acute distress, red hair, blue eyes, right handed Head: normocephalic, no dysmorphic features Ears, Nose and Throat: Otoscopic: tympanic membranes normal; pharynx: oropharynx is pink without exudates or tonsillar hypertrophy Neck: supple, full range of  motion, no cranial or cervical bruits Respiratory: auscultation clear Cardiovascular: no murmurs, pulses are normal Musculoskeletal: no skeletal deformities or apparent scoliosis Skin: no rashes or neurocutaneous lesions  Neurologic Exam  Mental Status: alert; oriented to person, place and year; knowledge is normal for age; language is normal Cranial Nerves: visual fields are full to double simultaneous stimuli; extraocular movements are full and conjugate; pupils are round reactive to light; funduscopic examination shows sharp disc margins with normal vessels; symmetric facial strength; midline tongue and uvula; air conduction is greater than bone conduction bilaterally Motor: Normal strength, tone and mass; good fine motor movements; no pronator drift Sensory: intact responses to cold, vibration, proprioception and stereognosis Coordination: good finger-to-nose, rapid repetitive alternating movements and finger apposition Gait and Station: normal gait and station: patient is able to walk on heels, toes and tandem without difficulty; balance is adequate; Romberg exam is negative; Gower response is negative Reflexes: symmetric and diminished bilaterally; no clonus; bilateral flexor plantar responses   Assessment 1.  Focal epilepsy with impairment of consciousness, G40.109.  Discussion I am pleased that Zuzanna continues to have complete control of her seizures and no side effects.  I see no reason to make any changes in her medication.  Plan Prescription was issued for 1 months of treatment with lamotrigine with 5 refills.  She will see Dr. Patrcia Dolly in mid-September.  I will be available until that time as needed.  Greater than 50% of a 30-minute visit was spent in counseling coordination of care discussing her seizure control, her plans for further education, and transition of care.   Medication List    Accurate as of May 08, 2021  3:15 PM. If you have any questions, ask your nurse  or doctor.     cetirizine 10 MG tablet Commonly known as: ZYRTEC Take 10 mg by mouth daily.   LamoTRIgine 200 MG Tb24 24 hour tablet Take 1 tablet (200 mg total) by mouth 2 (two) times daily.   tretinoin 0.1 % cream Commonly known as: RETIN-A daily.     The medication list was reviewed and reconciled. All changes or newly prescribed medications were explained.  A complete medication list was provided to the patient/caregiver.  Deetta Perla MD

## 2021-05-14 ENCOUNTER — Other Ambulatory Visit: Payer: Self-pay

## 2021-05-14 ENCOUNTER — Ambulatory Visit (INDEPENDENT_AMBULATORY_CARE_PROVIDER_SITE_OTHER): Payer: Self-pay

## 2021-05-14 DIAGNOSIS — M722 Plantar fascial fibromatosis: Secondary | ICD-10-CM

## 2021-05-14 DIAGNOSIS — B351 Tinea unguium: Secondary | ICD-10-CM

## 2021-05-14 NOTE — Progress Notes (Signed)
Patient presents for the 3rd EPAT treatment today with complaint of left heel pain . Diagnosed with plantar fasciitis by Dr. Al Corpus. This has been ongoing for several months. The patient has tried ice, stretching, NSAIDS and supportive shoe gear with no long term relief.   Most of the pain is located in the center of the heel .  ESWT administered and tolerated well.Treatment settings initiated at:   Energy: 25  Ended treatment session today with 3000 shocks at the following settings:   Energy: 25  Frequency: 4.0  Joules: 24.52  Short CAM boot was dispensed at this visit.   Reviewed post EPAT instructions. Advised to avoid ice and NSAIDs throughout the treatment process and to utilize boot or supportive shoes for at least the next 3 days.  Follow up for 4th treatment in 2 weeks.

## 2021-05-17 ENCOUNTER — Encounter (INDEPENDENT_AMBULATORY_CARE_PROVIDER_SITE_OTHER): Payer: Self-pay

## 2021-05-27 ENCOUNTER — Telehealth: Payer: Self-pay | Admitting: Cardiology

## 2021-05-27 NOTE — Telephone Encounter (Signed)
Spoke with pt's mother and answered her questions in regards to the echocardiogram tomorrow. She thanked me for calling her. No more questions expressed at this time.

## 2021-05-27 NOTE — Telephone Encounter (Signed)
Pt's mother is reaching out because no one have called the patient in regards to the Echo she is having tomorrow morning. She wants to know if the patient can eat. Pt's mother does not want our office to reach out to the patient because she is in college and she will not answer her phone so please reach out to her mother  Larita Fife with any details. [phone]

## 2021-05-28 ENCOUNTER — Other Ambulatory Visit: Payer: Self-pay

## 2021-05-28 ENCOUNTER — Ambulatory Visit (HOSPITAL_COMMUNITY): Payer: BC Managed Care – PPO | Attending: Cardiology

## 2021-05-28 ENCOUNTER — Ambulatory Visit (INDEPENDENT_AMBULATORY_CARE_PROVIDER_SITE_OTHER): Payer: Self-pay

## 2021-05-28 DIAGNOSIS — R002 Palpitations: Secondary | ICD-10-CM | POA: Insufficient documentation

## 2021-05-28 DIAGNOSIS — B351 Tinea unguium: Secondary | ICD-10-CM

## 2021-05-28 DIAGNOSIS — M722 Plantar fascial fibromatosis: Secondary | ICD-10-CM

## 2021-05-28 LAB — ECHOCARDIOGRAM COMPLETE
Area-P 1/2: 3.48 cm2
S' Lateral: 3.1 cm

## 2021-05-28 NOTE — Progress Notes (Signed)
Patient presents for the 4th EPAT treatment today with complaint of left heel pain . Diagnosed with plantar fasciitis by Dr. Al Corpus. This has been ongoing for several months. The patient has tried ice, stretching, NSAIDS and supportive shoe gear with no long term relief.   Most of the pain is located in the center of the heel .  ESWT administered and tolerated well.Treatment settings initiated at:   Energy: 30  Ended treatment session today with 3000 shocks at the following settings:   Energy: 30  Frequency: 4.0  Joules: 29.43  Short CAM boot was dispensed at this visit.   Reviewed post EPAT instructions. Advised to avoid ice and NSAIDs throughout the treatment process and to utilize boot or supportive shoes for at least the next 3 days.  Follow up with Dr. Al Corpus in 6 weeks.

## 2021-05-29 ENCOUNTER — Telehealth: Payer: Self-pay | Admitting: Cardiology

## 2021-05-29 NOTE — Telephone Encounter (Signed)
Called pt's mother, no answer. Unable to leave a message at this time.

## 2021-05-29 NOTE — Telephone Encounter (Signed)
Spoke with pt's parents. See chart.

## 2021-05-29 NOTE — Telephone Encounter (Signed)
Patient's mother call to get update on the results of there daughter test. She would like a call back today she doesn't want to go into the weekend not knowing. Please advise

## 2021-05-29 NOTE — Telephone Encounter (Signed)
Per Dr. Servando Salina pt can be added as a video/virtual visit today or Monday.

## 2021-06-01 ENCOUNTER — Telehealth: Payer: Self-pay | Admitting: Cardiology

## 2021-06-01 NOTE — Telephone Encounter (Signed)
Patient is requesting to speak with Dr. Mallory Shirk nurse to review her heart monitor results.

## 2021-06-01 NOTE — Telephone Encounter (Signed)
Sent a MyChart message letting pt know the results are not released to me. I will call her when I get them.

## 2021-06-05 ENCOUNTER — Encounter (HOSPITAL_BASED_OUTPATIENT_CLINIC_OR_DEPARTMENT_OTHER): Payer: Self-pay | Admitting: Family Medicine

## 2021-06-06 DIAGNOSIS — H66001 Acute suppurative otitis media without spontaneous rupture of ear drum, right ear: Secondary | ICD-10-CM | POA: Diagnosis not present

## 2021-06-11 ENCOUNTER — Other Ambulatory Visit: Payer: Self-pay

## 2021-06-11 ENCOUNTER — Ambulatory Visit (INDEPENDENT_AMBULATORY_CARE_PROVIDER_SITE_OTHER): Payer: BC Managed Care – PPO | Admitting: Neurology

## 2021-06-11 ENCOUNTER — Encounter: Payer: Self-pay | Admitting: Neurology

## 2021-06-11 DIAGNOSIS — G40209 Localization-related (focal) (partial) symptomatic epilepsy and epileptic syndromes with complex partial seizures, not intractable, without status epilepticus: Secondary | ICD-10-CM

## 2021-06-11 MED ORDER — LAMOTRIGINE ER 200 MG PO TB24: 1.0000 | ORAL_TABLET | Freq: Two times a day (BID) | ORAL | 11 refills | Status: DC

## 2021-06-11 NOTE — Patient Instructions (Addendum)
Good to meet you! Refills have been sent for Lamotrigine ER 200mg  twice a day. Send me a message on MyChart when you are back and we will do the [name] level at that time. Follow-up in 1 year, call for any changes.   Seizure Precautions:1. If medication has been prescribed for you to prevent seizures, take it exactly as directed.  Do not stop taking the medicine without talking to your doctor first, even if you have not had a seizure in a long time.   2. Avoid activities in which a seizure would cause danger to yourself or to others.  Don't operate dangerous machinery, swim alone, or climb in high or dangerous places, such as on ladders, roofs, or girders.  Do not drive unless your doctor says you may.  3. If you have any warning that you may have a seizure, lay down in a safe place where you can't hurt yourself.    4.  No driving for 6 months from last seizure, as per [facility].   Please refer to the following link on the Epilepsy Foundation of America's website for more information: http://www.epilepsyfoundation.org/answerplace/Social/driving/drivingu.cfm   5.  Maintain good sleep hygiene. Avoid alcohol.  6.  Notify your neurology if you are planning pregnancy or if you become pregnant.  7.  Contact your doctor if you have any problems that may be related to the medicine you are taking.  8.  Call 911 and bring the patient back to the ED if:        A.  The seizure lasts longer than 5 minutes.       B.  The patient doesn't awaken shortly after the seizure  C.  The patient has new problems such as difficulty seeing, speaking or moving  D.  The patient was injured during the seizure  E.  The patient has a temperature over 102 F (39C)  F.  The patient vomited and now is having trouble breathing

## 2021-06-11 NOTE — Progress Notes (Signed)
NEUROLOGY CONSULTATION NOTE  Denise Graves MRN: [id number] DOB: [birth year]  Referring provider: Dr. Ellison Carwin Primary care provider: Dr. Ceasar Mons [place]  Reason for consult:  establish adult epilepsy care  Dear Dr Sharene Skeans:  Thank you for your kind referral of Denise Graves for consultation of the above symptoms. Although her history is well known to you, please allow me to reiterate it for the purpose of our medical record. The patient was accompanied to the clinic by her mother who also provides collateral information. Her father also briefly came in at the end of the visit. Records and images were personally reviewed where available.  HISTORY OF PRESENT ILLNESS: This is a pleasant 20 year old right-handed woman with a history of focal epilepsy presenting to establish adult epilepsy care. Records from her pediatric neurologist Dr. Sharene Skeans were reviewed. Seizures started about 10 days before her first birthday in the setting of fever. Almost a year later she had another seizure. EEG and brain MRI in 2003 were normal. She was started on Depakene. She was seizure-free for 2 years, repeat EEG was normal, and medication was tapered and discontinued in 2006. After 3 months, she had another seizure and Depakote was restarted. She had an EEG in 08/2008 with 2-3 second parieto-occipital bursts of 2 Hz sharply contoured slow wave activity. EEG in 07/2009 showed the same pattern. Repeat EEG in 07/2010 showed frequent runs of 4 Hz occipitally predominant spike and slow wave discharge, also seen on EEG in 08/2011. She again became seizure-free and had a normal EEG in 2015, Depakote was weaned off.  In the spring of 2018, she had an onset of episodes of feeling of warmth, dull headache, and deja vu. EEG in 06/2017 was normal. She had a 72-hour EEG with 2 episodes during sleep, one lasting 26 seconds, another lasting 10 seconds, with centrally predominant rhythmic delta-range activity in the first and  sharply contoured slow-wave activity without clear field in the other. There were no clinical changes seen. Brain MRI in 08/2017 was normal. She had an increase in frequency of episodes with similar feeling of warmth in her lips, mouth, jaw, bad taste in her mouth, feeling hot and sweaty, with heart racing. Her mother witnessed her trying to speak as if her mouth were full or slightly chewing on her words. She would be completely alert during them, and following an episode she would have a bowel movement within minutes and a slight headache. In 11/2017, they were on vacation in [place] when she had another similar episode that progressed to mastication, hands were clenched, eyes open but unable to communicate or respond for the first time. Her mother recalls that she woke up and said she was not feeling good, went to the bathroom but did not come out. They found her on the commode with repetitive movements in both hands, eyes glazed, with incoherent speech. She was diaphoretic and flushed in her chest. This lasted for a couple of minutes, she was confused and tired after. She had another similar seizure that day. She was started on Lamotrigine ER, dose increased to 200mg  BID with no further similar symptoms since 11/2017. She denies any focal numbness/tingling/weakness, myoclonic jerks. No headaches, dizziness, diplopia, dysarthria/dysphagia, neck/back pain, bowel/bladder dysfunction. No side effects on Lamotrigine. She usually gets 6-7 hours of restful sleep. Mood is good. 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 Memory is okay. She lives with 2 roommates. She  is driving.   Epilepsy Risk Factors:  A paternal second cousin has epilepsy. Her father had a febrile seizure. She had febrile seizures in childhood. Otherwise she had a normal birth and early development.  There is no history of CNS infections such as meningitis/encephalitis, significant  traumatic brain injury, neurosurgical procedures.  Prior AEDs: Depakote  Laboratory Data: Last [name] level in 01/2020 was 6.3     PAST MEDICAL HISTORY: Past Medical History:  Diagnosis Date   Seizures (HCC)     PAST SURGICAL HISTORY: Past Surgical History:  Procedure Laterality Date   MOUTH SURGERY     STRABISMUS SURGERY  07/29/08   TOENAIL EXCISION Right 07/2012    MEDICATIONS: Current Outpatient Medications on File Prior to Visit  Medication Sig Dispense Refill   cetirizine (ZYRTEC) 10 MG tablet Take 10 mg by mouth daily.     LamoTRIgine 200 MG TB24 24 hour tablet Take 1 tablet (200 mg total) by mouth 2 (two) times daily. 62 tablet 5   tretinoin (RETIN-A) 0.1 % cream daily.     No current facility-administered medications on file prior to visit.    ALLERGIES: Allergies  Allergen Reactions   Other Rash    Crab, Atlantic White Fish   Pumpkin Seed Oil Hives and Nausea And Vomiting   Mobic [Meloxicam]     Per pt's mom-mom highly allergic and would prefer her not to ever take   Nutmeg Oil (Myristica Oil) Hives   Pistachio Nut (Diagnostic) Itching and Rash    FAMILY HISTORY: Family History  Problem Relation Age of Onset   Seizures Father        Febrile Sz   Colon cancer Maternal Grandmother        Died at age 57   Seizures Cousin        Maternal 2nd Cousin   Birth defects Cousin        2nd Cousin w an ill defined birth defect   Heart attack Maternal Grandfather    Lung cancer Paternal Grandmother     SOCIAL HISTORY: Social History   Socioeconomic History   Marital status: Single    Spouse name: Not on file   Number of children: Not on file   Years of education: Not on file   Highest education level: Not on file  Occupational History   Not on file  Tobacco Use   Smoking status: Never   Smokeless tobacco: Never  Vaping Use   Vaping Use: Never used  Substance and Sexual Activity   Alcohol use: No   Drug use: No   Sexual activity: Never     Comment: VIRGIN  Other Topics Concern   Not on file  Social History Narrative   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   She lives with both parents. She has no siblings.   She enjoys rowing, crafting and reading books.   She attends [employer].   Social Determinants of Health   Financial Resource Strain: Not on file  Food Insecurity: Not on file  Transportation Needs: Not on file  Physical Activity: Not on file  Stress: Not on file  Social Connections: Not on file  Intimate Partner Violence: Not on file     PHYSICAL EXAM: Vitals:   06/11/21 1028  BP: 117/64  Pulse: 71  SpO2: 100%   General: No acute distress Head:  Normocephalic/atraumatic Skin/Extremities: No rash, no edema Neurological Exam: Mental status: alert and oriented  to person, place, and time, no dysarthria or aphasia, Fund of knowledge is appropriate.  Recent and remote memory are intact, 3/3 delayed recall.  Attention and concentration are normal, 5/5 WORLD backward. Cranial nerves: CN I: not tested CN II: pupils equal, round and reactive to light, visual fields intact CN III, IV, VI:  full range of motion, no nystagmus, no ptosis CN V: facial sensation intact CN VII: upper and lower face symmetric CN VIII: hearing intact to conversation Bulk & Tone: normal, no fasciculations. Motor: 5/5 throughout with no pronator drift. Sensation: intact to light touch, cold, pin, vibration sense.  No extinction to double simultaneous stimulation.  Romberg test negative Deep Tendon Reflexes: +2 throughout Cerebellar: no incoordination on finger to nose testing Gait: narrow-based and steady, able to tandem walk adequately. Tremor: none   IMPRESSION: This is a pleasant 20 year old right-handed woman with a history of seizures since childhood, prior EEGs suggestive of a generalized epilepsy, seizure-free off medication for 3 years until 2018 when she had recurrent  episodes of deja vu, feeling of warmth, with oral and hand automatisms suggestive of focal epilepsy. EEG was non-lateralizing. She has been seizure-free since 11/2017 on Lamotrigine ER 200mg  BID without side effects. Issues in women with epilepsy were discussed, we will do a baseline [name] level when she is back for vacation. She is aware of [place] driving laws to stop driving after a seizure until 6 months seizure-free. We discussed avoidance of seizure triggers, including missing medication, sleep deprivation, and alcohol. Follow-up in 1 year, call for any changes.   Thank you for allowing me to participate in the care of this patient. Please do not hesitate to call for any questions or concerns.   , M.D.  CC: Dr. Patrcia Dolly, Dr. de Sharene Skeans

## 2021-06-18 NOTE — Telephone Encounter (Signed)
error 

## 2021-06-28 ENCOUNTER — Encounter: Payer: Self-pay | Admitting: Family

## 2021-06-28 ENCOUNTER — Telehealth: Payer: BC Managed Care – PPO | Admitting: Family

## 2021-06-28 DIAGNOSIS — H65193 Other acute nonsuppurative otitis media, bilateral: Secondary | ICD-10-CM

## 2021-06-28 MED ORDER — AMOXICILLIN-POT CLAVULANATE 875-125 MG PO TABS: 1.0000 | ORAL_TABLET | Freq: Two times a day (BID) | ORAL | 0 refills | Status: DC

## 2021-06-28 NOTE — Patient Instructions (Signed)

## 2021-06-28 NOTE — Progress Notes (Signed)
Virtual Visit Consent   Denise Graves, you are scheduled for a virtual visit with a [facility] provider today.     Just as with appointments in the office, your consent must be obtained to participate.  Your consent will be active for this visit and any virtual visit you may have with one of our providers in the next 365 days.     If you have a MyChart account, a copy of this consent can be sent to you electronically.  All virtual visits are billed to your insurance company just like a traditional visit in the office.    As this is a virtual visit, video technology does not allow for your provider to perform a traditional examination.  This may limit your provider's ability to fully assess your condition.  If your provider identifies any concerns that need to be evaluated in person or the need to arrange testing (such as labs, EKG, etc.), we will make arrangements to do so.     Although advances in technology are sophisticated, we cannot ensure that it will always work on either your end or our end.  If the connection with a video visit is poor, the visit may have to be switched to a telephone visit.  With either a video or telephone visit, we are not always able to ensure that we have a secure connection.     I need to obtain your verbal consent now.   Are you willing to proceed with your visit today?    Daleen Steinhaus has provided verbal consent on 06/28/2021 for a virtual visit (video or telephone).   Jannifer Rodney, FNP   Date: 06/28/2021 7:55 PM   Virtual Visit via Video Note   I, Jannifer Rodney, connected with  Denise Graves  ([id number], [birth year]) on 06/28/21 at  7:45 PM EDT by a video-enabled telemedicine application and verified that I am speaking with the correct person using two identifiers.  Location: Patient: Virtual Visit Location Patient: Home Provider: Virtual Visit Location Provider: Home   I discussed the limitations of evaluation and management by telemedicine and the  availability of in person appointments. The patient expressed understanding and agreed to proceed.    History of Present Illness: Denise Graves is a 20 y.o. who identifies as a female who was assigned female at birth, and is being seen today for ear pain that started two days ago and has worsen.  HPI: Otalgia  There is pain in both ears. The current episode started in the past 7 days. The problem occurs constantly. The problem has been gradually worsening. There has been no fever. The pain is at a severity of 7/10. The pain is moderate. Pertinent negatives include no coughing, diarrhea, ear discharge, headaches, hearing loss, rhinorrhea or sore throat. She has tried NSAIDs for the symptoms. The treatment provided mild relief.   Problems:  Patient Active Problem List   Diagnosis Date Noted   Seizures (HCC) 05/01/2021   Acute dysfunction of both eustachian tubes 03/07/2018   Esotropia 03/07/2018   Sleep disturbance, unspecified 03/07/2018   PCOS (polycystic ovarian syndrome) 12/20/2017   Focal epilepsy with impairment of consciousness (HCC) 11/25/2017   Encounter for long-term (current) use of other medications 08/13/2013   S/P eye surgery, follow-up exam 04/24/2013    Allergies:  Allergies  Allergen Reactions   Other Rash    Crab, Atlantic White Fish   Pumpkin Seed Oil Hives and Nausea And Vomiting   Mobic [Meloxicam]     Per  pt's mom-mom highly allergic and would prefer her not to ever take   Nutmeg Oil (Myristica Oil) Hives   Pistachio Nut (Diagnostic) Itching and Rash   Medications:  Current Outpatient Medications:    amoxicillin-clavulanate (AUGMENTIN) 875-125 MG tablet, Take 1 tablet by mouth 2 (two) times daily., Disp: 14 tablet, Rfl: 0   cetirizine (ZYRTEC) 10 MG tablet, Take 10 mg by mouth daily., Disp: , Rfl:    LamoTRIgine 200 MG TB24 24 hour tablet, Take 1 tablet (200 mg total) by mouth 2 (two) times daily., Disp: 62 tablet, Rfl: 11   tretinoin (RETIN-A) 0.1 % cream,  daily., Disp: , Rfl:   Observations/Objective: Patient is well-developed, well-nourished in no acute distress.  Resting comfortably at home.  Head is normocephalic, atraumatic.  No labored breathing. Speech is clear and coherent with logical content.  Patient is alert and oriented at baseline.    Assessment and Plan: 1. Other acute nonsuppurative otitis media of both ears, recurrence not specified - amoxicillin-clavulanate (AUGMENTIN) 875-125 MG tablet; Take 1 tablet by mouth 2 (two) times daily.  Dispense: 14 tablet; Refill: 0 - Take meds as prescribed - Use a cool mist humidifier  -Use saline nose sprays frequently -Force fluids -For any cough or congestion  Use plain Mucinex- regular strength or max strength is fine -For fever or aces or pains- take tylenol or ibuprofen Follow up if symptoms worsen or do not improve   Follow Up Instructions: I discussed the assessment and treatment plan with the patient. The patient was provided an opportunity to ask questions and all were answered. The patient agreed with the plan and demonstrated an understanding of the instructions.  A copy of instructions were sent to the patient via MyChart unless otherwise noted below.     The patient was advised to call back or seek an in-person evaluation if the symptoms worsen or if the condition fails to improve as anticipated.  Time:  I spent 7 minutes with the patient via telehealth technology discussing the above problems/concerns.    Jannifer Rodney, FNP

## 2021-06-30 DIAGNOSIS — H5043 Accommodative component in esotropia: Secondary | ICD-10-CM | POA: Diagnosis not present

## 2021-06-30 DIAGNOSIS — Z09 Encounter for follow-up examination after completed treatment for conditions other than malignant neoplasm: Secondary | ICD-10-CM | POA: Diagnosis not present

## 2021-07-09 ENCOUNTER — Ambulatory Visit: Payer: BC Managed Care – PPO | Admitting: Podiatry

## 2021-07-09 ENCOUNTER — Ambulatory Visit: Payer: BC Managed Care – PPO | Admitting: Cardiology

## 2021-07-30 ENCOUNTER — Ambulatory Visit: Payer: BC Managed Care – PPO | Admitting: Podiatry

## 2021-07-30 ENCOUNTER — Encounter: Payer: Self-pay | Admitting: Neurology

## 2021-08-20 ENCOUNTER — Other Ambulatory Visit: Payer: Self-pay

## 2021-08-20 ENCOUNTER — Encounter: Payer: Self-pay | Admitting: Neurology

## 2021-08-20 DIAGNOSIS — G40209 Localization-related (focal) (partial) symptomatic epilepsy and epileptic syndromes with complex partial seizures, not intractable, without status epilepticus: Secondary | ICD-10-CM

## 2021-08-25 ENCOUNTER — Other Ambulatory Visit (INDEPENDENT_AMBULATORY_CARE_PROVIDER_SITE_OTHER): Payer: BC Managed Care – PPO

## 2021-08-25 ENCOUNTER — Telehealth: Payer: Self-pay | Admitting: Neurology

## 2021-08-25 ENCOUNTER — Other Ambulatory Visit: Payer: Self-pay

## 2021-08-25 DIAGNOSIS — G40209 Localization-related (focal) (partial) symptomatic epilepsy and epileptic syndromes with complex partial seizures, not intractable, without status epilepticus: Secondary | ICD-10-CM | POA: Diagnosis not present

## 2021-08-25 NOTE — Telephone Encounter (Signed)
Called patients mother back and she stated that she had not heard anything back in regards to her daughters lab. I informed patients mother that patient sent a mychart message and heather replied right away to informed her that she would mail out lab orders and that was the only time patient had reached out to [place] in regards to her labs. Patients mother stated that they have not received the orders and are in route to our office to have labs done. I informed patients mother that they can check in with [place] on the 3rd floor and head down to the lab and that they don't need lab orders to have labs done there as everything is in epic. Patients mother stated that because "her doctor is on vacation" she is needing her daughter to be seen by another Neurologist right away because she has now developed a tremor and her mother wants her to have an emergency appt. I informed patients mother that that we are short staffed with providers but I would send her message to the covering physician to see what they say. Patients mother stated she is willing to wait in the parking lot for 8 hours to get her daughter seen.

## 2021-08-25 NOTE — Telephone Encounter (Signed)
Called and spoke to patients mother. Informed patients mother that Dr. Everlena Cooper wants to see what the lamotrigine level shows. Also, If she is concerned that she is going to have a seizure, then she should bring her to the ED. Patients mother stated that she is upset with how long she has had to wait for the [name] lab to come in and feels that her daughter wouldn't have had to wait this long had the lab process be explained to them back when patient sent her Mychart message. I explained to patients mother that we ask that patients f/u with their lab orders and imaging if they haven't hear anything back. Patients mother stated that they have yet to receive the lab orders in the mail and I explained to her that we don't have control on [employer] and it does take a little longer for our mail to reach patients as it has to be processed at [facility] prior to being sent to [employer].  While on the phone with patients mother I Gayla Doss at the lab to see if we could expedite pick up for patients [name] lab and Boyd Kerbs was able to order it stat pick up. Patients mother was informed of this and glad.  I explained to patients mother that I understand the frustration she feels and informed her to reach out to me at any time to check on her daughters labs. I informed patients mother that if she is worried about her daughter and if symptoms worsen to please take her to the Emergency Department for prompt care. Patients mother verbalized understanding and thanked me and stated they will keep a look out for lab results.

## 2021-08-25 NOTE — Telephone Encounter (Signed)
Pt's mother called in stating the patient has been having shaking in her hands and symptoms indicating beginning signs of a seizure. They are very worried and want to prevent any seizure from coming on. She is going to come in and get her labs done today. She is concerned and doesn't want to wait until Dr. Karel Jarvis is back to get help. They don't want her to lose her licence if she has a seizure.

## 2021-08-25 NOTE — Telephone Encounter (Signed)
Patient's mom called and said the lab told her the results will take up to a week for the results to come back and that is too long to wait.  She'd like the status changed on the labs to expedite them.

## 2021-08-26 ENCOUNTER — Telehealth: Payer: Self-pay | Admitting: Neurology

## 2021-08-26 ENCOUNTER — Ambulatory Visit (HOSPITAL_BASED_OUTPATIENT_CLINIC_OR_DEPARTMENT_OTHER): Payer: BC Managed Care – PPO | Admitting: Family Medicine

## 2021-08-26 ENCOUNTER — Encounter (HOSPITAL_BASED_OUTPATIENT_CLINIC_OR_DEPARTMENT_OTHER): Payer: Self-pay | Admitting: Family Medicine

## 2021-08-26 VITALS — BP 118/70 | HR 76 | Ht 72.0 in | Wt 183.0 lb

## 2021-08-26 DIAGNOSIS — G40209 Localization-related (focal) (partial) symptomatic epilepsy and epileptic syndromes with complex partial seizures, not intractable, without status epilepticus: Secondary | ICD-10-CM

## 2021-08-26 DIAGNOSIS — R251 Tremor, unspecified: Secondary | ICD-10-CM | POA: Diagnosis not present

## 2021-08-26 NOTE — Patient Instructions (Signed)
  Medication Instructions:  Your physician recommends that you continue on your current medications as directed. Please refer to the Current Medication list given to you today. --If you need a refill on any your medications before your next appointment, please call your pharmacy first. If no refills are authorized on file call the office.-- Lab Work: Your physician has recommended that you have lab work today: CBC, CMP, and Thyroid If you have labs (blood work) drawn today and your tests are completely normal, you will receive your results via MyChart message OR a phone call from our staff.  Please ensure you check your voicemail in the event that you authorized detailed messages to be left on a delegated number. If you have any lab test that is abnormal or we need to change your treatment, we will call you to review the results.  Follow-Up: Your next appointment:   Your physician recommends that you schedule a follow-up appointment in: as needed with Dr. de [place]  You will receive a text message or e-mail with a link to a survey about your care and experience with [place] today! We would greatly appreciate your feedback!   Thanks for letting us be apart of your health journey!!  Primary Care and Sports Medicine   Dr. Ceasar Mons [place]   We encourage you to activate your patient portal called "MyChart".  Sign up information is provided on this After Visit Summary.  MyChart is used to connect with patients for Virtual Visits (Telemedicine).  Patients are able to view lab/test results, encounter notes, upcoming appointments, etc.  Non-urgent messages can be sent to your provider as well. To learn more about what you can do with MyChart, please visit --  [email or web address].

## 2021-08-26 NOTE — Telephone Encounter (Signed)
Tay from Dr. Gayla Medicus office, patient's PCP, called and requested to schedule an appointment for the patient next week with Dr. Karel Jarvis.  Patient is being seen in their office this morning.  She was advised lab results are pending and scheduling will be based on that.  Dellis Filbert is requesting a call back to her and not the patient or her mom for scheduling at: [phone].  Routing back for review and advice on scheduling.

## 2021-08-26 NOTE — Telephone Encounter (Signed)
Pt called an advised that we can not set up an appoint with her PCP and that DR Karel Jarvis is out of that office this week and she is booked up next week. When she returns she will see the phone notes and her lab results and if we can we will see if we can work her in. Pt advised that if she starts to fell or get worse between now and then to go to the ER for evaluation and treatment pt response was YEP!

## 2021-08-26 NOTE — Assessment & Plan Note (Signed)
Over about the past week, patient has noticed shaking/tremor of bilateral hands.  Began on 11/28 and continued.  12/1 and then stopped occurring briefly until 12/5.  Symptoms will occur a couple times per day, primarily last for few seconds, although did have a longer episode earlier this week.  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  Reports that these symptoms are new for her.  Prior seizures did not similar symptoms to what has been occurring over the past week. During episodes her hands will feel somewhat clammy She denies any associated numbness or tingling Denies any recent dietary changes Denies any sleep deprivation or sleep changes Mom did reach out to patient's new neurologist office.  Unfortunately, her neurologist is out of the office and mother reports that she was informed that it was a long wait for patient to be able to schedule an office visit for evaluation.  Patient has had labs drawn for lamotrigine level to be assessed, results are still pending Uncertain etiology, could be related to lamotrigine outside of therapeutic range, could also be a direct side effect of lamotrigine itself.  Seems less likely that it would be related to organic seizure etiology. We will check CBC, CMP, TSH with reflex today to rule out alternative causes Follow-up on result of lamotrigine level Recommend scheduling follow-up with neurology as soon as they are able to schedule in the office Plan for follow-up in the office as needed

## 2021-08-26 NOTE — Progress Notes (Signed)
    Procedures performed today:    None.  Independent interpretation of notes and tests performed by another provider:   None.  Brief History, Exam, Impression, and Recommendations:    BP 118/70   Pulse 76   Ht 6' (1.829 m)   Wt 183 lb (83 kg)   SpO2 99%   BMI 24.82 kg/m   Tremor of both hands Over about the past week, patient has noticed shaking/tremor of bilateral hands.  Began on 11/28 and continued.  12/1 and then stopped occurring briefly until 12/5.  Symptoms will occur a couple times per day, primarily last for few seconds, although did have a longer episode earlier this week.  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  Reports that these symptoms are new for her.  Prior seizures did not similar symptoms to what has been occurring over the past week. During episodes her hands will feel somewhat clammy She denies any associated numbness or tingling Denies any recent dietary changes Denies any sleep deprivation or sleep changes Mom did reach out to patient's new neurologist office.  Unfortunately, her neurologist is out of the office and mother reports that she was informed that it was a long wait for patient to be able to schedule an office visit for evaluation.  Patient has had labs drawn for lamotrigine level to be assessed, results are still pending Uncertain etiology, could be related to lamotrigine outside of therapeutic range, could also be a direct side effect of lamotrigine itself.  Seems less likely that it would be related to organic seizure etiology. We will check CBC, CMP, TSH with reflex today to rule out alternative causes Follow-up on result of lamotrigine level Recommend scheduling follow-up with neurology as soon as they are able to schedule in the office Plan for follow-up in the office as needed  Spent 30 minutes on this patient encounter, including preparation, chart review, face-to-face counseling with  patient and coordination of care, and documentation of encounter   ___________________________________________ Sherill Mangen de [place], MD, ABFM, CAQSM Primary Care and Sports Medicine [facility]

## 2021-08-27 LAB — CBC WITH DIFFERENTIAL/PLATELET
Basophils Absolute: 0 10*3/uL (ref 0.0–0.2)
Basos: 0 %
EOS (ABSOLUTE): 0.1 10*3/uL (ref 0.0–0.4)
Eos: 1 %
Hematocrit: 37.3 % (ref 34.0–46.6)
Hemoglobin: 12.6 g/dL (ref 11.1–15.9)
Immature Grans (Abs): 0 10*3/uL (ref 0.0–0.1)
Immature Granulocytes: 0 %
Lymphocytes Absolute: 2 10*3/uL (ref 0.7–3.1)
Lymphs: 28 %
MCH: 30.8 pg (ref 26.6–33.0)
MCHC: 33.8 g/dL (ref 31.5–35.7)
MCV: 91 fL (ref 79–97)
Monocytes Absolute: 0.6 10*3/uL (ref 0.1–0.9)
Monocytes: 8 %
Neutrophils Absolute: 4.5 10*3/uL (ref 1.4–7.0)
Neutrophils: 63 %
Platelets: 315 10*3/uL (ref 150–450)
RBC: 4.09 x10E6/uL (ref 3.77–5.28)
RDW: 13 % (ref 11.7–15.4)
WBC: 7.1 10*3/uL (ref 3.4–10.8)

## 2021-08-27 LAB — COMPREHENSIVE METABOLIC PANEL
ALT: 11 IU/L (ref 0–32)
AST: 16 IU/L (ref 0–40)
Albumin/Globulin Ratio: 2 (ref 1.2–2.2)
Albumin: 4.6 g/dL (ref 3.9–5.0)
Alkaline Phosphatase: 62 IU/L (ref 42–106)
BUN/Creatinine Ratio: 15 (ref 9–23)
BUN: 13 mg/dL (ref 6–20)
Bilirubin Total: 0.4 mg/dL (ref 0.0–1.2)
CO2: 25 mmol/L (ref 20–29)
Calcium: 9.4 mg/dL (ref 8.7–10.2)
Chloride: 103 mmol/L (ref 96–106)
Creatinine, Ser: 0.89 mg/dL (ref 0.57–1.00)
Globulin, Total: 2.3 g/dL (ref 1.5–4.5)
Glucose: 88 mg/dL (ref 70–99)
Potassium: 5.1 mmol/L (ref 3.5–5.2)
Sodium: 140 mmol/L (ref 134–144)
Total Protein: 6.9 g/dL (ref 6.0–8.5)
eGFR: 95 mL/min/{1.73_m2} (ref >59)

## 2021-08-27 LAB — TSH RFX ON ABNORMAL TO FREE T4: TSH: 2.08 u[IU]/mL (ref 0.450–4.500)

## 2021-08-27 NOTE — Telephone Encounter (Signed)
I see the results of PCP testing, but do not see [name] level. Has she had it done, pls have patient do [name] level so we can have more information to treat her better, thanks

## 2021-08-29 LAB — LAMOTRIGINE LEVEL: Lamotrigine Lvl: 8 ug/mL (ref 2.0–20.0)

## 2021-08-31 ENCOUNTER — Telehealth: Payer: Self-pay

## 2021-08-31 NOTE — Telephone Encounter (Signed)
Pt mother called in stated that patients mother said she cant today, she is at school,mother said the best day is Thursday, cant do today, tomorrow or Wednesday. Dr Karel Jarvis has been informed

## 2021-08-31 NOTE — Telephone Encounter (Signed)
Pt called no answer left a voice mail to call the office back if pt calls back she can come in today at either 2 or 2:30 per Dr Karel Jarvis, tried to call her mothers phone she answered and hung up the phone,

## 2021-08-31 NOTE — Telephone Encounter (Signed)
[name] level normal. Offered spot on cancellation list today 08/31/21, patient unable to come.

## 2021-09-02 ENCOUNTER — Encounter: Payer: Self-pay | Admitting: Podiatry

## 2021-09-03 ENCOUNTER — Ambulatory Visit (INDEPENDENT_AMBULATORY_CARE_PROVIDER_SITE_OTHER): Payer: BC Managed Care – PPO | Admitting: Podiatry

## 2021-09-03 ENCOUNTER — Ambulatory Visit (INDEPENDENT_AMBULATORY_CARE_PROVIDER_SITE_OTHER): Payer: BC Managed Care – PPO | Admitting: Neurology

## 2021-09-03 ENCOUNTER — Ambulatory Visit (INDEPENDENT_AMBULATORY_CARE_PROVIDER_SITE_OTHER): Payer: BC Managed Care – PPO

## 2021-09-03 ENCOUNTER — Encounter: Payer: Self-pay | Admitting: Podiatry

## 2021-09-03 ENCOUNTER — Telehealth: Payer: Self-pay | Admitting: *Deleted

## 2021-09-03 ENCOUNTER — Other Ambulatory Visit: Payer: Self-pay

## 2021-09-03 VITALS — BP 114/75 | HR 72 | Ht 72.0 in | Wt 185.4 lb

## 2021-09-03 DIAGNOSIS — R251 Tremor, unspecified: Secondary | ICD-10-CM | POA: Diagnosis not present

## 2021-09-03 DIAGNOSIS — M778 Other enthesopathies, not elsewhere classified: Secondary | ICD-10-CM

## 2021-09-03 DIAGNOSIS — G40209 Localization-related (focal) (partial) symptomatic epilepsy and epileptic syndromes with complex partial seizures, not intractable, without status epilepticus: Secondary | ICD-10-CM | POA: Diagnosis not present

## 2021-09-03 MED ORDER — METHYLPREDNISOLONE 4 MG PO TBPK: ORAL_TABLET | ORAL | 0 refills | Status: DC

## 2021-09-03 MED ORDER — TRIAMCINOLONE ACETONIDE 40 MG/ML IJ SUSP
20.0000 mg | Freq: Once | INTRAMUSCULAR | Status: AC
  Administered 2021-09-03: 20 mg

## 2021-09-03 NOTE — Telephone Encounter (Signed)
Please advise 

## 2021-09-03 NOTE — Patient Instructions (Signed)
Good to see you. Your exam is normal, symptoms suggestive of enhanced physiologic tremor. Let's keep an eye on it, please take a video if it happens again, call for any change in symptoms. Continue working on good sleep quality, you can try the Mediterranean diet as well if you wish. Follow-up as scheduled, call for any changes

## 2021-09-03 NOTE — Progress Notes (Signed)
NEUROLOGY FOLLOW UP OFFICE NOTE  Denise Graves [id number] March 30, 2001  HISTORY OF PRESENT ILLNESS: I had the pleasure of seeing Denise Graves in follow-up in the neurology clinic on 09/03/2021.  The patient was last seen 3 months ago for epilepsy.She presents today for an earlier appointment due to new symptoms of hand tremors. She is again accompanied by her mother who helps supplement the history today.  Records and images were personally reviewed where available.  They contacted our office on 08/25/21 that she was having shaking/tremor in her hands, worried it would progress to a seizure. She had her [name] level done 08/25/21: 8.0. She also saw her PCP the next day with normal CBC, CMP, TSH. She reported shaking/tremor in both hands that began on 11/28 and continued until 12/1, stopped then occurred briefly until 12/5. They would occur a couple of times per day lasting a few seconds. 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 One time it occurred while she was just sitting in class, another time while she was standing doing an oral presentation. Her mother saw the last episode that occurred on 12/5, she was getting dressed and it lasted for 3-4 seconds, then she had another one lasting a minute while typing. When she had the tremors while rowing she was halfway through testing when both hands started shaking for 4 minutes so she did not do well for the test. Afterwards, her hands feel clammy, "like nervous hands." There is no associated neck pain, numbness/tingling, confusion. She denies any anxiety. No recent infections, medications, alcohol, head injuries. No family history of tremors. She had not had an ideal week with sleep that time.    History on Initial Assessment 06/11/2021: This is a pleasant 20 year old right-handed woman with a history of focal epilepsy presenting to establish adult epilepsy care. Records from her pediatric neurologist Dr. Sharene Graves  were reviewed. Seizures started about 10 days before her first birthday in the setting of fever. Almost a year later she had another seizure. EEG and brain MRI in 2003 were normal. She was started on Depakene. She was seizure-free for 2 years, repeat EEG was normal, and medication was tapered and discontinued in 2006. After 3 months, she had another seizure and Depakote was restarted. She had an EEG in 08/2008 with 2-3 second parieto-occipital bursts of 2 Hz sharply contoured slow wave activity. EEG in 07/2009 showed the same pattern. Repeat EEG in 07/2010 showed frequent runs of 4 Hz occipitally predominant spike and slow wave discharge, also seen on EEG in 08/2011. She again became seizure-free and had a normal EEG in 2015, Depakote was weaned off.  In the spring of 2018, she had an onset of episodes of feeling of warmth, dull headache, and deja vu. EEG in 06/2017 was normal. She had a 72-hour EEG with 2 episodes during sleep, one lasting 26 seconds, another lasting 10 seconds, with centrally predominant rhythmic delta-range activity in the first and sharply contoured slow-wave activity without clear field in the other. There were no clinical changes seen. Brain MRI in 08/2017 was normal. She had an increase in frequency of episodes with similar feeling of warmth in her lips, mouth, jaw, bad taste in her mouth, feeling hot and sweaty, with heart racing. Her mother witnessed her trying to speak as if her mouth were full or slightly chewing on her words. She would be completely alert during them, and following an episode she would have a  bowel movement within minutes and a slight headache. In 11/2017, they were on vacation in [place] when she had another similar episode that progressed to mastication, hands were clenched, eyes open but unable to communicate or respond for the first time. Her mother recalls that she woke up and said she was not feeling good, went to the bathroom but did not come out. They found her  on the commode with repetitive movements in both hands, eyes glazed, with incoherent speech. She was diaphoretic and flushed in her chest. This lasted for a couple of minutes, she was confused and tired after. She had another similar seizure that day. She was started on Lamotrigine ER, dose increased to 200mg  BID with no further similar symptoms since 11/2017. She denies any focal numbness/tingling/weakness, myoclonic jerks. No headaches, dizziness, diplopia, dysarthria/dysphagia, neck/back pain, bowel/bladder dysfunction. No side effects on Lamotrigine. She usually gets 6-7 hours of restful sleep. Mood is good. 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 Memory is okay. She lives with 2 roommates. She is driving.   Epilepsy Risk Factors:  A paternal second cousin has epilepsy. Her father had a febrile seizure. She had febrile seizures in childhood. Otherwise she had a normal birth and early development.  There is no history of CNS infections such as meningitis/encephalitis, significant traumatic brain injury, neurosurgical procedures.  Prior AEDs: Depakote  Laboratory Data: Last [name] level in 01/2020 was 6.3   PAST MEDICAL HISTORY: Past Medical History:  Diagnosis Date   Seizures (HCC)     MEDICATIONS: Current Outpatient Medications on File Prior to Visit  Medication Sig Dispense Refill   cetirizine (ZYRTEC) 10 MG tablet Take 10 mg by mouth daily.     LamoTRIgine 200 MG TB24 24 hour tablet Take 1 tablet (200 mg total) by mouth 2 (two) times daily. 62 tablet 11   tretinoin (RETIN-A) 0.1 % cream daily.     No current facility-administered medications on file prior to visit.    ALLERGIES: Allergies  Allergen Reactions   Other Rash    Crab, Atlantic White Fish   Pumpkin Seed Oil Hives and Nausea And Vomiting   Mobic [Meloxicam]     Per pt's mom-mom highly allergic and would prefer her not to ever take   Nutmeg Oil  (Myristica Oil) Hives   Pistachio Nut (Diagnostic) Itching and Rash    FAMILY HISTORY: Family History  Problem Relation Age of Onset   Seizures Father        Febrile Sz   Colon cancer Maternal Grandmother        Died at age 61   Seizures Cousin        Maternal 2nd Cousin   Birth defects Cousin        2nd Cousin w an ill defined birth defect   Heart attack Maternal Grandfather    Lung cancer Paternal Grandmother     SOCIAL HISTORY: Social History   Socioeconomic History   Marital status: Single    Spouse name: Not on file   Number of children: Not on file   Years of education: Not on file   Highest education level: Not on file  Occupational History   Not on file  Tobacco Use   Smoking status: Never   Smokeless tobacco: Never  Vaping Use   Vaping Use: Never used  Substance and Sexual Activity   Alcohol use: No   Drug use: No   Sexual  activity: Never    Comment: [place]  Other Topics Concern   Not on file  Social History Narrative   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   She lives with both parents. She has no siblings.   She enjoys rowing, crafting and reading books.   She attends [employer].   Right handed    Social Determinants of Health   Financial Resource Strain: Not on file  Food Insecurity: Not on file  Transportation Needs: Not on file  Physical Activity: Not on file  Stress: Not on file  Social Connections: Not on file  Intimate Partner Violence: Not on file     PHYSICAL EXAM: Vitals:   09/03/21 1012  BP: 114/75  Pulse: 72  SpO2: 98%   General: No acute distress Head:  Normocephalic/atraumatic Skin/Extremities: No rash, no edema Neurological Exam: alert and awake. No aphasia or dysarthria. Fund of knowledge is appropriate.  Attention and concentration are normal.   Cranial nerves: Pupils equal, round. Extraocular movements intact with no nystagmus. Visual fields full.  No facial  asymmetry.  Motor: Bulk and tone normal, muscle strength 5/5 throughout with no pronator drift. Sensation intact.  Finger to nose testing intact. Reflexes +2 throughout. Gait narrow-based and steady, able to tandem walk adequately.  Romberg negative. No tremors in the office today. No cogwheeling, good RAMs.   IMPRESSION: This is a pleasant 20 yo RH woman with a history of seizures since childhood, prior EEGs suggestive of a generalized epilepsy, seizure-free off medication for 3 years until 2018 when she had recurrent episodes of deja vu, feeling of warmth, with oral and hand automatisms suggestive of focal epilepsy. EEG was non-lateralizing. She has been seizure-free since 11/2017 on Lamotrigine ER 200mg  BID without side effects. She presents today for new symptoms of intermittent hand tremors that lasted for a week, none since 08/24/21. Her exam today is normal, no tremors noted. [name] level was normal. Discussed that symptoms are suggestive of enhanced physiologic tremor, possibly triggered by poor sleep at that time. Continue to monitor, they will try to take a video if it recurs. We discussed measures to try to help. She is aware of [place] driving laws to stop driving after a seizure until 6 months seizure-free. Follow-up as scheduled in 04/2022, they know to call for any changes.   Thank you for allowing me to participate in her care.  Please do not hesitate to call for any questions or concerns.    05/2022, M.D.   CC: Dr. De Patrcia Dolly

## 2021-09-03 NOTE — Telephone Encounter (Signed)
Patient's mom(Lynn) is calling because she thought a oral steroid was mentioned during visit. Returned the call to patient,no answer, left a message that a medrol dosepak has been sent to pharmacy on file.

## 2021-09-03 NOTE — Telephone Encounter (Signed)
Patient is scheduled at 3:00pm per Morrie Sheldon

## 2021-09-05 NOTE — Progress Notes (Signed)
She presents with her mother today complaining of painful hallux right.  States that she has aching with radiating pain through the toe she states that is been going on now for 2 weeks and seems to be getting worse.  She is a Environmental consultant.  Objective: Vital signs are stable she is alert and oriented x3 she has some mild swelling on medial aspect of the first metatarsophalangeal joint mildly tender on palpation she has pain on direct palpation of the joint as well as on dorsiflexion.  It is not warm to the touch does not demonstrate symptoms of gout.  Radiographically does demonstrate an osseously mature individual with some soft tissue swelling along the medial aspect of the first metatarsal phalangeal joint the DP does not demonstrate any type of osseous abnormalities of the sesamoids or of the joint.  However the lateral does demonstrate slightly sclerotic sesamoids as well as a small dorsal spur consistent with hallux limitus.  Assessment: Capsulitis early hallux limitus cannot rule out avascular necrosis sesamoid.  Plan: After sterile Betadine skin prep I injected the first metatarsophalangeal joint today with 2 mg of dexamethasone local anesthetic.  Should this not alleviate her symptomatology considers MRI.

## 2021-09-14 ENCOUNTER — Telehealth: Payer: BC Managed Care – PPO | Admitting: Physician Assistant

## 2021-09-14 ENCOUNTER — Encounter (HOSPITAL_BASED_OUTPATIENT_CLINIC_OR_DEPARTMENT_OTHER): Payer: Self-pay | Admitting: Family Medicine

## 2021-09-14 DIAGNOSIS — H66001 Acute suppurative otitis media without spontaneous rupture of ear drum, right ear: Secondary | ICD-10-CM

## 2021-09-14 MED ORDER — DOXYCYCLINE HYCLATE 100 MG PO TABS: 100.0000 mg | ORAL_TABLET | Freq: Two times a day (BID) | ORAL | 0 refills | Status: DC

## 2021-09-14 NOTE — Progress Notes (Signed)
E-Visit for Sinus Problems  We are sorry that you are not feeling well.  Here is how we plan to help!  Based on what you have shared with me it looks like you have sinusitis.  Sinusitis is inflammation and infection in the sinus cavities of the head.  Based on your presentation I believe you most likely have Acute Bacterial Sinusitis and Acute Otitis Media of the right ear.  This is an infection caused by bacteria and is treated with antibiotics. I have prescribed Doxycycline 100mg  by mouth twice a day for 10 days. You may use an oral decongestant such as Mucinex D or if you have glaucoma or high blood pressure use plain Mucinex. Saline nasal spray help and can safely be used as often as needed for congestion.  If you develop worsening sinus pain, fever or notice severe headache and vision changes, or if symptoms are not better after completion of antibiotic, please schedule an appointment with a health care provider.    Sinus infections are not as easily transmitted as other respiratory infection, however we still recommend that you avoid close contact with loved ones, especially the very young and elderly.  Remember to wash your hands thoroughly throughout the day as this is the number one way to prevent the spread of infection!  Home Care: Only take medications as instructed by your medical team. Complete the entire course of an antibiotic. Do not take these medications with alcohol. A steam or ultrasonic humidifier can help congestion.  You can place a towel over your head and breathe in the steam from hot water coming from a faucet. Avoid close contacts especially the very young and the elderly. Cover your mouth when you cough or sneeze. Always remember to wash your hands.  Get Help Right Away If: You develop worsening fever or sinus pain. You develop a severe head ache or visual changes. Your symptoms persist after you have completed your treatment plan.  Make sure you Understand these  instructions. Will watch your condition. Will get help right away if you are not doing well or get worse.  Thank you for choosing an e-visit.  Your e-visit answers were reviewed by a board certified advanced clinical practitioner to complete your personal care plan. Depending upon the condition, your plan could have included both over the counter or prescription medications.  Please review your pharmacy choice. Make sure the pharmacy is open so you can pick up prescription now. If there is a problem, you may contact your provider through and have the prescription routed to another pharmacy.  Your safety is important to [employer]. If you have drug allergies check your prescription carefully.   For the next 24 hours you can use MyChart to ask questions about today's visit, request a non-urgent call back, or ask for a work or school excuse. You will get an email in the next two days asking about your experience. I hope that your e-visit has been valuable and will speed your recovery.  I provided 5 minutes of non face-to-face time during this encounter for chart review and documentation.

## 2021-09-15 ENCOUNTER — Encounter: Payer: Self-pay | Admitting: Neurology

## 2021-09-22 ENCOUNTER — Ambulatory Visit: Payer: BC Managed Care – PPO | Admitting: Podiatry

## 2021-09-29 ENCOUNTER — Emergency Department (HOSPITAL_BASED_OUTPATIENT_CLINIC_OR_DEPARTMENT_OTHER)
Admission: EM | Admit: 2021-09-29 | Discharge: 2021-09-29 | Disposition: A | Discharge status: Home / Self Care | Payer: 59 | Attending: Emergency Medicine | Admitting: Emergency Medicine

## 2021-09-29 ENCOUNTER — Emergency Department (HOSPITAL_BASED_OUTPATIENT_CLINIC_OR_DEPARTMENT_OTHER): Payer: 59

## 2021-09-29 ENCOUNTER — Other Ambulatory Visit: Payer: Self-pay

## 2021-09-29 ENCOUNTER — Other Ambulatory Visit (HOSPITAL_BASED_OUTPATIENT_CLINIC_OR_DEPARTMENT_OTHER): Payer: Self-pay

## 2021-09-29 ENCOUNTER — Encounter (HOSPITAL_BASED_OUTPATIENT_CLINIC_OR_DEPARTMENT_OTHER): Payer: Self-pay | Admitting: Emergency Medicine

## 2021-09-29 ENCOUNTER — Ambulatory Visit (HOSPITAL_BASED_OUTPATIENT_CLINIC_OR_DEPARTMENT_OTHER): Payer: BC Managed Care – PPO | Admitting: Family Medicine

## 2021-09-29 DIAGNOSIS — R1030 Lower abdominal pain, unspecified: Secondary | ICD-10-CM | POA: Diagnosis not present

## 2021-09-29 DIAGNOSIS — M545 Low back pain, unspecified: Secondary | ICD-10-CM | POA: Diagnosis present

## 2021-09-29 LAB — CBC
HCT: 35.7 % — ABNORMAL LOW (ref 36.0–46.0)
Hemoglobin: 11.9 g/dL — ABNORMAL LOW (ref 12.0–15.0)
MCH: 30.4 pg (ref 26.0–34.0)
MCHC: 33.3 g/dL (ref 30.0–36.0)
MCV: 91.3 fL (ref 80.0–100.0)
Platelets: 248 10*3/uL (ref 150–400)
RBC: 3.91 MIL/uL (ref 3.87–5.11)
RDW: 13.3 % (ref 11.5–15.5)
WBC: 10.4 10*3/uL (ref 4.0–10.5)
nRBC: 0 % (ref 0.0–0.2)

## 2021-09-29 LAB — BASIC METABOLIC PANEL
Anion gap: 11 (ref 5–15)
BUN: 16 mg/dL (ref 6–20)
CO2: 24 mmol/L (ref 22–32)
Calcium: 9.4 mg/dL (ref 8.9–10.3)
Chloride: 104 mmol/L (ref 98–111)
Creatinine, Ser: 0.97 mg/dL (ref 0.44–1.00)
GFR, Estimated: >60 mL/min (ref >60)
Glucose, Bld: 108 mg/dL — ABNORMAL HIGH (ref 70–99)
Potassium: 3.7 mmol/L (ref 3.5–5.1)
Sodium: 139 mmol/L (ref 135–145)

## 2021-09-29 LAB — HCG, SERUM, QUALITATIVE: Preg, Serum: NEGATIVE

## 2021-09-29 MED ORDER — LORAZEPAM 2 MG/ML IJ SOLN
1.0000 mg | Freq: Once | INTRAMUSCULAR | Status: AC
Start: 2021-09-29 — End: 2021-09-29
  Administered 2021-09-29: 1 mg via INTRAVENOUS
  Filled 2021-09-29: qty 1

## 2021-09-29 MED ORDER — SODIUM CHLORIDE 0.9 % IV BOLUS (SEPSIS)
1000.0000 mL | Freq: Once | INTRAVENOUS | Status: AC
  Administered 2021-09-29: 1000 mL via INTRAVENOUS

## 2021-09-29 MED ORDER — HYDROMORPHONE HCL 1 MG/ML IJ SOLN
1.0000 mg | Freq: Once | INTRAMUSCULAR | Status: AC
  Administered 2021-09-29: 1 mg via INTRAVENOUS
  Filled 2021-09-29: qty 1

## 2021-09-29 MED ORDER — NAPROXEN 375 MG PO TABS
375.0000 mg | ORAL_TABLET | Freq: Two times a day (BID) | ORAL | 0 refills | Status: DC
  Filled 2021-09-29: qty 20, 10d supply, fill #0

## 2021-09-29 MED ORDER — HYDROCODONE-ACETAMINOPHEN 5-325 MG PO TABS
1.0000 | ORAL_TABLET | Freq: Four times a day (QID) | ORAL | 0 refills | Status: DC | PRN
  Filled 2021-09-29: qty 12, 3d supply, fill #0

## 2021-09-29 MED ORDER — KETOROLAC TROMETHAMINE 30 MG/ML IJ SOLN
30.0000 mg | Freq: Once | INTRAMUSCULAR | Status: AC
  Administered 2021-09-29: 30 mg via INTRAVENOUS
  Filled 2021-09-29: qty 1

## 2021-09-29 MED ORDER — MORPHINE SULFATE (PF) 4 MG/ML IV SOLN
4.0000 mg | Freq: Once | INTRAVENOUS | Status: AC
  Administered 2021-09-29: 4 mg via INTRAVENOUS
  Filled 2021-09-29: qty 1

## 2021-09-29 MED ORDER — DIAZEPAM 5 MG PO TABS
5.0000 mg | ORAL_TABLET | Freq: Three times a day (TID) | ORAL | 0 refills | Status: DC | PRN
  Filled 2021-09-29: qty 12, 4d supply, fill #0

## 2021-09-29 MED ORDER — SODIUM CHLORIDE 0.9 % IV SOLN: 1000.0000 mL | INTRAVENOUS | Status: DC

## 2021-09-29 MED ORDER — HYDROMORPHONE HCL 1 MG/ML IJ SOLN
0.5000 mg | INTRAMUSCULAR | Status: DC | PRN
  Administered 2021-09-29: 0.5 mg via INTRAVENOUS
  Filled 2021-09-29: qty 1

## 2021-09-29 NOTE — ED Notes (Signed)
Pt states that noting is touching her pain and reports no relief

## 2021-09-29 NOTE — ED Triage Notes (Signed)
Pt arrives to ED with c/o back pain. Pt was on a rowing machine, working out and suddenly felt severe lower back pain. Pt reports L>R. Pain is described as throbbing. No radiation of pain to legs. No numbness/tingling in legs.

## 2021-09-29 NOTE — ED Notes (Signed)
Pt stating she is still in Pain. Dr. Lynelle Doctor came to bedside and spoke once again to the Pt and the Parent. Charge Rn also respond. Charge consulted with on-coming MD who looked over Pt's chart once again. Pt complains of muscle spasm in lower back. Appropriate medication given for same

## 2021-09-29 NOTE — ED Notes (Signed)
Pt seen previously in ER, called back asking for school note from visit. Printed and provided for patient.

## 2021-09-29 NOTE — ED Provider Notes (Signed)
[facility] EMERGENCY DEPT Provider Note   CSN: [id number] Arrival date & time: 09/29/21  1540     History  Chief Complaint  Patient presents with   Back Pain    Denise Graves is a 20 y.o. female.   Back Pain  Patient has a history of seizure disorder but no history of abdominal or back pain problems presents to the ED with complaints of severe acute back pain that started this morning.  Patient was training on an ERG rowing device.  She states she was doing her usual warm up as part of the team workout.  There was nothing strenuous about it.  Patient suddenly started having sharp severe pain in her lower back.  Patient was able to get up and go to another room and she tried to use a massaging device but the pain did not get any better.  Patient's pain intensified and now it severe in her lower back.  She had difficulty even getting out of a car.  When the pain is intense she feels weak in her legs but the pain does not radiate into her legs.  It does not radiate into her abdomen.  She is not having any vomiting or diarrhea.  No dysuria.  Home Medications Prior to Admission medications   Medication Sig Start Date End Date Taking? Authorizing Provider  cetirizine (ZYRTEC) 10 MG tablet Take 10 mg by mouth daily.   Yes [provider]  diazepam (VALIUM) 5 MG tablet Take 1 tablet (5 mg total) by mouth every 8 (eight) hours as needed for muscle spasms. 09/29/21  Yes Linwood Dibbles, MD  HYDROcodone-acetaminophen (NORCO/VICODIN) 5-325 MG tablet Take 1 tablet by mouth every 6 (six) hours as needed. 09/29/21  Yes Linwood Dibbles, MD  LamoTRIgine 200 MG TB24 24 hour tablet Take 1 tablet (200 mg total) by mouth 2 (two) times daily. 06/11/21  Yes Van Clines, MD  naproxen (NAPROSYN) 375 MG tablet Take 1 tablet (375 mg total) by mouth 2 (two) times daily. 09/29/21  Yes Linwood Dibbles, MD  tretinoin (RETIN-A) 0.1 % cream daily. 04/23/21  Yes [provider]  doxycycline (VIBRA-TABS)  100 MG tablet Take 1 tablet (100 mg total) by mouth 2 (two) times daily. Patient not taking: Reported on 09/29/2021 09/14/21   Margaretann Loveless, PA-C  methylPREDNISolone (MEDROL DOSEPAK) 4 MG TBPK tablet 6 day dose pack - take as directed Patient not taking: Reported on 09/29/2021 09/03/21   Ernestene Kiel T, DPM      Allergies    Other, Pumpkin seed oil, Mobic [meloxicam], Nutmeg oil (myristica oil), and Pistachio nut (diagnostic)    Review of Systems   Review of Systems  Musculoskeletal:  Positive for back pain.   Physical Exam Updated Vital Signs BP 117/73    Pulse 93    Temp 98.2 F (36.8 C) (Oral)    Resp 18    Ht 1.829 m (6')    Wt 83 kg    SpO2 100%    BMI 24.82 kg/m  Physical Exam Vitals and nursing note reviewed.  Constitutional:      General: She is in acute distress.     Appearance: She is well-developed.     Comments: Appears to be in pain  HENT:     Head: Normocephalic and atraumatic.     Right Ear: External ear normal.     Left Ear: External ear normal.  Eyes:     General: No scleral icterus.  Right eye: No discharge.        Left eye: No discharge.     Conjunctiva/sclera: Conjunctivae normal.  Neck:     Trachea: No tracheal deviation.  Cardiovascular:     Rate and Rhythm: Normal rate.  Pulmonary:     Effort: Pulmonary effort is normal. No respiratory distress.     Breath sounds: No stridor.  Abdominal:     General: There is no distension.     Palpations: There is no mass.     Tenderness: There is no abdominal tenderness.  Musculoskeletal:        General: No swelling or deformity.     Cervical back: Neck supple.     Comments: No tenderness to palpation in the extremities, no tenderness palpation in the paraspinal region or lumbar spine, patient does have difficulty moving her back  Skin:    General: Skin is warm and dry.     Findings: No rash.  Neurological:     Mental Status: She is alert.     Cranial Nerves: Cranial nerve deficit: no gross  deficits.     Comments: 5-5 strength and sensation bilateral lower extremities, extremities warm and well-perfused    ED Results / Procedures / Treatments   Labs (all labs ordered are listed, but only abnormal results are displayed) Labs Reviewed  CBC - Abnormal; Notable for the following components:      Result Value   Hemoglobin 11.9 (*)    HCT 35.7 (*)    All other components within normal limits  BASIC METABOLIC PANEL - Abnormal; Notable for the following components:   Glucose, Bld 108 (*)    All other components within normal limits  HCG, SERUM, QUALITATIVE  URINALYSIS, ROUTINE W REFLEX MICROSCOPIC    EKG None  Radiology US Pelvis Complete  Result Date: 09/29/2021 CLINICAL DATA:  flank pain EXAM: TRANSABDOMINAL ULTRASOUND OF PELVIS DOPPLER ULTRASOUND OF OVARIES TECHNIQUE: Transabdominal ultrasound examination of the pelvis was performed including evaluation of the uterus, ovaries, adnexal regions, and pelvic cul-de-sac. Color and duplex Doppler ultrasound was utilized to evaluate blood flow to the ovaries. COMPARISON:  None. FINDINGS: Uterus Measurements: 9.6 x 3.3 x 5.4 cm = volume: 9.6 mL mL. No fibroids or other mass visualized. Endometrium Thickness: 11.0 mm.  No focal abnormality visualized. Right ovary Measurements: 3.7 x 2.3 x 3.2 cm = volume: 14.2 mL. Normal appearance/no adnexal mass. Left ovary Measurements: 4.6 x 2.1 x 4.1 cm = volume: 20.5 mL. Normal appearance/no adnexal mass. Pulsed Doppler evaluation demonstrates normal low-resistance arterial and venous waveforms in both ovaries. Other: Trace physiologic simple free fluid in the pelvis. IMPRESSION: Normal pelvic ultrasound. Electronically Signed   By: Caprice RenshawJacob  Kahn M.D.   On: 09/29/2021 15:11   US Art/Ven Flow Abd Pelv Doppler  Result Date: 09/29/2021 CLINICAL DATA:  flank pain EXAM: TRANSABDOMINAL ULTRASOUND OF PELVIS DOPPLER ULTRASOUND OF OVARIES TECHNIQUE: Transabdominal ultrasound examination of the pelvis was  performed including evaluation of the uterus, ovaries, adnexal regions, and pelvic cul-de-sac. Color and duplex Doppler ultrasound was utilized to evaluate blood flow to the ovaries. COMPARISON:  None. FINDINGS: Uterus Measurements: 9.6 x 3.3 x 5.4 cm = volume: 9.6 mL mL. No fibroids or other mass visualized. Endometrium Thickness: 11.0 mm.  No focal abnormality visualized. Right ovary Measurements: 3.7 x 2.3 x 3.2 cm = volume: 14.2 mL. Normal appearance/no adnexal mass. Left ovary Measurements: 4.6 x 2.1 x 4.1 cm = volume: 20.5 mL. Normal appearance/no adnexal mass. Pulsed Doppler evaluation demonstrates  normal low-resistance arterial and venous waveforms in both ovaries. Other: Trace physiologic simple free fluid in the pelvis. IMPRESSION: Normal pelvic ultrasound. Electronically Signed   By: Caprice RenshawJacob  Kahn M.D.   On: 09/29/2021 15:11   CT Renal Stone Study  Result Date: 09/29/2021 CLINICAL DATA:  Flank pain, kidney stone suspected. Severe lower back pain. EXAM: CT ABDOMEN AND PELVIS WITHOUT CONTRAST TECHNIQUE: Multidetector CT imaging of the abdomen and pelvis was performed following the standard protocol without IV contrast. COMPARISON:  None. FINDINGS: Lower chest: Lung bases are clear.  No pleural effusions. Hepatobiliary: Normal appearance of the gallbladder without inflammatory changes. Subtle low-density in the inferior right hepatic lobe on sequence 3, image 37 measures 1.3 cm. Otherwise, normal appearance of the liver on this exam without intravascular contrast. Pancreas: Unremarkable. No pancreatic ductal dilatation or surrounding inflammatory changes. Spleen: Normal in size without focal abnormality. Adrenals/Urinary Tract: Normal appearance of the adrenal glands. Normal appearance of the urinary bladder. Normal appearance of both kidneys without stones or hydronephrosis. No suspicious renal lesions. Stomach/Bowel: Stomach is within normal limits. No evidence for bowel distention or focal bowel  inflammation. Vascular/Lymphatic: No significant vascular findings are present. No enlarged abdominal or pelvic lymph nodes. Reproductive: Uterus and bilateral adnexa are unremarkable. Other: Small amount of free fluid in the pelvis is likely physiologic. Negative for free air. Musculoskeletal: No acute bone abnormality. IMPRESSION: 1. No acute abnormality in the abdomen or pelvis. Specifically, no evidence for kidney stones or hydronephrosis. 2. Small amount of free fluid in the pelvis is likely physiologic. 3. Question a small low-density structure in the inferior right hepatic lobe measuring roughly 1.3 cm. This area is poorly visualized on this exam without intravascular contrast. Consider follow-up right upper quadrant ultrasound to see if there is small lesion in this area. Electronically Signed   By: Richarda OverlieAdam  Henn M.D.   On: 09/29/2021 12:31    Procedures Procedures    Medications Ordered in ED Medications  sodium chloride 0.9 % bolus 1,000 mL (1,000 mLs Intravenous New Bag/Given 09/29/21 1342)    Followed by  0.9 %  sodium chloride infusion (has no administration in time range)  HYDROmorphone (DILAUDID) injection 1 mg (1 mg Intravenous Given 09/29/21 1055)  LORazepam (ATIVAN) injection 1 mg (1 mg Intravenous Given 09/29/21 1148)  HYDROmorphone (DILAUDID) injection 1 mg (1 mg Intravenous Given 09/29/21 1216)  ketorolac (TORADOL) 30 MG/ML injection 30 mg (30 mg Intravenous Given 09/29/21 1309)    ED Course/ Medical Decision Making/ A&P Clinical Course as of 09/29/21 1541  Tue Sep 29, 2021  1115 CBC normal.  Pregnancy test is negative. [JK]  1116 Still having pain despite IV Dilaudid.  Will proceed with CT scan [JK]  1234 Patient still having persistent flank pain.  We will try a dose of Toradol. [JK]  1234 No acute abnormality noted on the CT scan [JK]  1234 Small amount of free fluid in the pelvis most likely to be physiologic [JK]  1302 Patient still having persistent pain.  Is possible this  is musculoskeletal in nature but with her degree of discomfort will proceed with ultrasound rule out ovarian torsion [JK]    Clinical Course User Index [JK] Linwood DibblesKnapp, Zymere Patlan, MD                           Medical Decision Making  Patient presented with intense pain in her lower back.  Patient required several doses of IV narcotics to help with  pain control.  Patient symptoms have improved but have not resolved.  Laboratory tests were unremarkable.  No signs of acute anemia.  No electrolyte abnormalities.  Pregnancy test was negative.  CT scan was performed to evaluate for the possibility of renal colic.  CT scan is reassuring.  No acute abdominal pathology.  No vascular pathology noted.  No ureteral stone.  Pelvic ultrasound was also performed to rule out the possibility of ovarian torsion.  Ultrasound was negative.  At this point I suspect her symptoms are musculoskeletal in nature.  We will discharge pain.  Findings and plan were discussed with the patient's parents as well as the patient.       Final Clinical Impression(s) / ED Diagnoses Final diagnoses:  Acute midline low back pain without sciatica    Rx / DC Orders ED Discharge Orders          Ordered    naproxen (NAPROSYN) 375 MG tablet  2 times daily        09/29/21 1539    HYDROcodone-acetaminophen (NORCO/VICODIN) 5-325 MG tablet  Every 6 hours PRN        09/29/21 1539    diazepam (VALIUM) 5 MG tablet  Every 8 hours PRN        09/29/21 1539              Linwood Dibbles, MD 09/29/21 1543

## 2021-09-29 NOTE — Discharge Instructions (Signed)
Take the medications as needed for pain.  Follow-up with an orthopedic or sports medicine doctor to be rechecked.  Return as needed for worsening symptoms

## 2021-09-30 ENCOUNTER — Other Ambulatory Visit (HOSPITAL_BASED_OUTPATIENT_CLINIC_OR_DEPARTMENT_OTHER): Payer: Self-pay

## 2021-10-01 ENCOUNTER — Encounter (HOSPITAL_BASED_OUTPATIENT_CLINIC_OR_DEPARTMENT_OTHER): Payer: Self-pay | Admitting: Family Medicine

## 2021-10-01 ENCOUNTER — Telehealth (HOSPITAL_BASED_OUTPATIENT_CLINIC_OR_DEPARTMENT_OTHER): Payer: Self-pay | Admitting: Family Medicine

## 2021-10-01 NOTE — Telephone Encounter (Signed)
Patient also sent mychart message. Please see that message for response. AS, CMA

## 2021-10-01 NOTE — Telephone Encounter (Signed)
Pt mother called in stated when pt went to Ed downstairs they are wanting an order for MRI. Pt is stated on bed rest. Pts mother would like a call from the nurse regarding if the order can just be place. Please advise.

## 2021-10-02 ENCOUNTER — Encounter (HOSPITAL_BASED_OUTPATIENT_CLINIC_OR_DEPARTMENT_OTHER): Payer: Self-pay | Admitting: Family Medicine

## 2021-10-02 ENCOUNTER — Ambulatory Visit (HOSPITAL_BASED_OUTPATIENT_CLINIC_OR_DEPARTMENT_OTHER): Payer: 59 | Admitting: Family Medicine

## 2021-10-02 ENCOUNTER — Other Ambulatory Visit: Payer: Self-pay

## 2021-10-02 DIAGNOSIS — M545 Low back pain, unspecified: Secondary | ICD-10-CM

## 2021-10-02 NOTE — Patient Instructions (Signed)
°  Medication Instructions:  Your physician recommends that you continue on your current medications as directed. Please refer to the Current Medication list given to you today. --If you need a refill on any your medications before your next appointment, please call your pharmacy first. If no refills are authorized on file call the office.--  Referrals/Procedures/Imaging: Your physician recommends that you have a Lumbar MRI. This can be completed at [facility] imaging. Contact information is listed below  [facility] [phone] [address] [place]  Follow-Up: Your next appointment:   Your physician recommends that you schedule a follow-up appointment as needed if symptoms worsen with Dr. de [place]  You will receive a text message or e-mail with a link to a survey about your care and experience with [place] today! We would greatly appreciate your feedback!   Thanks for letting us be apart of your health journey!!  Primary Care and Sports Medicine   Dr. Ceasar Mons [place]   We encourage you to activate your patient portal called "MyChart".  Sign up information is provided on this After Visit Summary.  MyChart is used to connect with patients for Virtual Visits (Telemedicine).  Patients are able to view lab/test results, encounter notes, upcoming appointments, etc.  Non-urgent messages can be sent to your provider as well. To learn more about what you can do with MyChart, please visit --  [email or web address].   s

## 2021-10-02 NOTE — Progress Notes (Signed)
° ° °  Procedures performed today:    None.  Independent interpretation of notes and tests performed by another provider:   None.  Brief History, Exam, Impression, and Recommendations:    BP 112/74    Pulse 98    Ht 6' (1.829 m)    Wt 184 lb (83.5 kg)    SpO2 98%    BMI 24.95 kg/m   Low back pain Patient seen in ED 09/29/21 due to acute low back pain. She reports that symptoms started 09/29/21 during rowing practice in the morning. At practice, was warming up, rowing on the ERG and was about 5 minutes in then had sharp, severe pain in low back, located bilaterally.  In the ED, had CT Renal, US Pelvis completed without significant abnormality - incidentally observed small structure in inferior right lobe with consideration for follow-up US to evaluate. Labs only with mild anemia. Patient received IV Dilaudid, Toradol in the ED; was discharged home with Norco, naproxen. No radiation of pain, no numbness or tingling. No prior similar episodes. Denies any issues with weakness, gait problems. More so just pain with standing from seated position. Has remained out of school and at home since initial onset of pain. Activity has been limited. Has been taking Norco, naproxen - naproxen twice daily typically, took Norco once yesterday. On exam, midline tenderness over spinous processes in lower lumbar spine, tenderness extends into paraspinal muscles bilaterally.  Very limited forward flexion due to pain.  Slightly reduced back extension due to pain.  Negative straight leg raise bilaterally.  Reflexes 2+ in bilateral lower extremities.  Distal neurovascular exam intact bilaterally. Reviewed labs and imaging with patient and mother.  No significant spinal abnormality observed on CT scan.  Did discuss limitations regarding soft tissue visualization on CT scan.  There is possibly a slight bulge of disc at L5-S1. Discussed options with patient and mother.  Given profound, severe pain which patient experienced which  required multiple doses of IV narcotic medication to control at initial onset and has still required some oral narcotic medication to help with controlling, will proceed with MRI without contrast. While awaiting imaging, can continue with conservative measures including NSAID, Tylenol, limited use of narcotic as needed.  Can utilize topical treatments as well.  Discussed need to avoid bedrest. Discussed red flag symptoms for which patient should return for further evaluation or present to emergency department Also recommend treatment with physical therapy, she has been working with physical therapy in the past and will be following up with her physical therapist regarding this Plan for follow-up as needed, pending results of imaging   ___________________________________________ Denise Frysinger de [place], MD, ABFM, CAQSM Primary Care and Sports Medicine [facility]

## 2021-10-02 NOTE — Assessment & Plan Note (Signed)
Patient seen in ED 09/29/21 due to acute low back pain. She reports that symptoms started 09/29/21 during rowing practice in the morning. At practice, was warming up, rowing on the ERG and was about 5 minutes in then had sharp, severe pain in low back, located bilaterally.  In the ED, had CT Renal, US Pelvis completed without significant abnormality - incidentally observed small structure in inferior right lobe with consideration for follow-up US to evaluate. Labs only with mild anemia. Patient received IV Dilaudid, Toradol in the ED; was discharged home with Norco, naproxen. No radiation of pain, no numbness or tingling. No prior similar episodes. Denies any issues with weakness, gait problems. More so just pain with standing from seated position. Has remained out of school and at home since initial onset of pain. Activity has been limited. Has been taking Norco, naproxen - naproxen twice daily typically, took Norco once yesterday. On exam, midline tenderness over spinous processes in lower lumbar spine, tenderness extends into paraspinal muscles bilaterally.  Very limited forward flexion due to pain.  Slightly reduced back extension due to pain.  Negative straight leg raise bilaterally.  Reflexes 2+ in bilateral lower extremities.  Distal neurovascular exam intact bilaterally. Reviewed labs and imaging with patient and mother.  No significant spinal abnormality observed on CT scan.  Did discuss limitations regarding soft tissue visualization on CT scan.  There is possibly a slight bulge of disc at L5-S1. Discussed options with patient and mother.  Given profound, severe pain which patient experienced which required multiple doses of IV narcotic medication to control at initial onset and has still required some oral narcotic medication to help with controlling, will proceed with MRI without contrast. While awaiting imaging, can continue with conservative measures including NSAID, Tylenol, limited use of  narcotic as needed.  Can utilize topical treatments as well.  Discussed need to avoid bedrest. Discussed red flag symptoms for which patient should return for further evaluation or present to emergency department Also recommend treatment with physical therapy, she has been working with physical therapy in the past and will be following up with her physical therapist regarding this Plan for follow-up as needed, pending results of imaging

## 2021-10-03 ENCOUNTER — Ambulatory Visit
Admission: RE | Admit: 2021-10-03 | Discharge: 2021-10-03 | Disposition: A | Discharge status: Home / Self Care | Payer: No Typology Code available for payment source | Source: Ambulatory Visit | Attending: Family Medicine | Admitting: Family Medicine

## 2021-10-03 DIAGNOSIS — M545 Low back pain, unspecified: Secondary | ICD-10-CM

## 2021-10-05 ENCOUNTER — Telehealth (HOSPITAL_BASED_OUTPATIENT_CLINIC_OR_DEPARTMENT_OTHER): Payer: Self-pay | Admitting: Family Medicine

## 2021-10-05 NOTE — Telephone Encounter (Signed)
Patient advised that MRI should not be scheduled until PA was completed Patient scheduled MRI independent of MRI and subsequently was self pay Patient contacted the office after hours on Friday 01/13 for an MRI to be completed on a Saturday at 6:15 am.

## 2021-10-05 NOTE — Telephone Encounter (Signed)
Please see telephone encounter regarding this matter. AS, CMA

## 2021-10-05 NOTE — Telephone Encounter (Signed)
Received fax from after hours regarding pt needing PA for MRI. Fax stated she had an appt scheduled for 1/14 at 6:15. Not sure if appt was had due to not having PA Please advise.

## 2021-10-07 ENCOUNTER — Encounter (HOSPITAL_BASED_OUTPATIENT_CLINIC_OR_DEPARTMENT_OTHER): Payer: Self-pay

## 2021-10-07 ENCOUNTER — Telehealth (HOSPITAL_BASED_OUTPATIENT_CLINIC_OR_DEPARTMENT_OTHER): Payer: Self-pay | Admitting: Family Medicine

## 2021-10-07 NOTE — Telephone Encounter (Signed)
Pt mother would like a call regarding the PA. Because pt payed in full and is needing that to get reinvested. Please call pt today. Please advise.

## 2021-10-27 ENCOUNTER — Telehealth: Payer: Self-pay

## 2021-10-27 ENCOUNTER — Telehealth: Payer: Self-pay | Admitting: Neurology

## 2021-10-27 NOTE — Telephone Encounter (Signed)
F/u   Andrena Mews Key: [id number] - PA Case ID: [id number] - Rx #: [id number] Need help? Call us at [phone] Outcome Approvedtoday Request Reference Number: [id number]. LAMOTRIGINE TAB 200MG  ER is approved through 10/27/2022. Your patient may now fill this prescription and it will be covered. Drug lamoTRIgine ER 200MG  er tablets Form [facility] Electronic Prior Authorization Form (2017 NCPDP) Original Claim Info 71

## 2021-10-27 NOTE — Telephone Encounter (Signed)
Patients mom called and stated she went to [facility] and her med was still not ready.  The lamoTRIgine.  She said her daughter needs this med.

## 2021-10-27 NOTE — Telephone Encounter (Signed)
New message   Your information has been sent to [facility].  Trula Slade Key: [id number] - PA Case ID: [id number] - Rx #[id number] Need help? Call us at [phone] Status Sent to [employer] lamoTRIgine ER 200MG  er tablets Form [facility] Electronic Prior Authorization Form (2017 NCPDP) [place]

## 2021-10-28 NOTE — Telephone Encounter (Signed)
Spoke with pt mother informed her that the PA has been done and we got the approval yesterday at 9 am, and that [facility] has it, phone was disconnected, called [facility] asked [facility] to [facility] on the PA they have it and her lamotrigine was still 200.00 with the PA. PT mother called back, told her sorry for the disconnection. She said she could hear me. I told her I called [facility] and they have the PA and ran it and stated that her medication was still 200.00. pt mother said she was going to call the insurance to see why it was still so high. Pt mother informed that I has looked at good RX its 42.02 at [facility]. Pt mother is going to call the office with an update after talking to the insurance ,

## 2022-01-21 ENCOUNTER — Encounter (HOSPITAL_BASED_OUTPATIENT_CLINIC_OR_DEPARTMENT_OTHER): Payer: Self-pay | Admitting: Family Medicine

## 2022-01-21 ENCOUNTER — Telehealth (HOSPITAL_BASED_OUTPATIENT_CLINIC_OR_DEPARTMENT_OTHER): Payer: Self-pay

## 2022-01-21 NOTE — Telephone Encounter (Signed)
Pt sent me a mychart message requesting for the provider to send over amoxicillin because she stated she had an ear infection. I stated to the pt she would have to come in. Pt mom came on the phone very upset and I expressed even Dr. [place] would need her to come in to see if it's an ear infection. They hung up.  ?

## 2022-02-08 DIAGNOSIS — Z9889 Other specified postprocedural states: Secondary | ICD-10-CM | POA: Insufficient documentation

## 2022-04-13 ENCOUNTER — Telehealth (HOSPITAL_BASED_OUTPATIENT_CLINIC_OR_DEPARTMENT_OTHER): Payer: Self-pay | Admitting: Family Medicine

## 2022-04-13 NOTE — Telephone Encounter (Signed)
Pts mother called calling back from the cancellation appt that was done for 8/11 due to Sports Physical walk in clinic for the day. Pts mother wanted to let us know that she is no longer a pt at this office so no need for the appt or to resch.

## 2022-04-30 ENCOUNTER — Ambulatory Visit: Payer: BC Managed Care – PPO | Admitting: Neurology

## 2022-04-30 ENCOUNTER — Encounter (HOSPITAL_BASED_OUTPATIENT_CLINIC_OR_DEPARTMENT_OTHER): Payer: BC Managed Care – PPO | Admitting: Family Medicine

## 2022-05-05 ENCOUNTER — Ambulatory Visit: Payer: BC Managed Care – PPO | Admitting: Neurology

## 2022-08-04 ENCOUNTER — Other Ambulatory Visit: Payer: Self-pay | Admitting: Neurology

## 2022-08-04 DIAGNOSIS — G40209 Localization-related (focal) (partial) symptomatic epilepsy and epileptic syndromes with complex partial seizures, not intractable, without status epilepticus: Secondary | ICD-10-CM

## 2022-08-06 ENCOUNTER — Ambulatory Visit (INDEPENDENT_AMBULATORY_CARE_PROVIDER_SITE_OTHER): Payer: 59 | Admitting: Neurology

## 2022-08-06 ENCOUNTER — Encounter: Payer: Self-pay | Admitting: Neurology

## 2022-08-06 DIAGNOSIS — G40209 Localization-related (focal) (partial) symptomatic epilepsy and epileptic syndromes with complex partial seizures, not intractable, without status epilepticus: Secondary | ICD-10-CM

## 2022-08-06 IMAGING — MR MR LUMBAR SPINE W/O CM
4 of 5 series · 27 of 48 positions shown · non-contrast
Comparison: None.

CLINICAL DATA: History of trauma with low back pain radiating to
the center of tailbone since September 2021

EXAM:
MRI LUMBAR SPINE WITHOUT CONTRAST
TECHNIQUE: Multiplanar, multisequence MR imaging of the lumbar spine was
performed. No intravenous contrast was administered.

[Series 3: T2 · sagittal · 4.0mm · 1.17mm/px · 6 of 15 slices shown (1 of 2)]
[im 1/15]
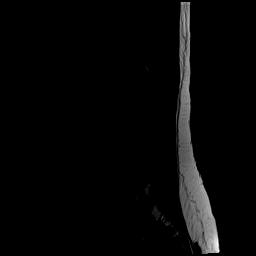
[im 3/15]
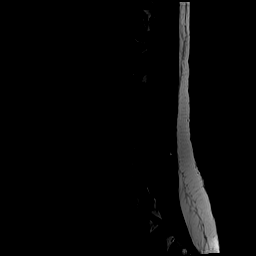
[im 6/15]
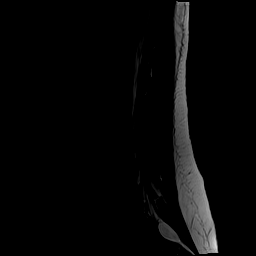
[im 9/15]
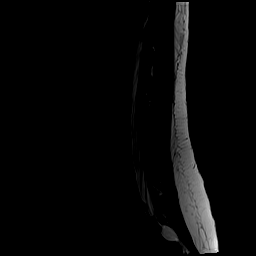
[im 12/15]
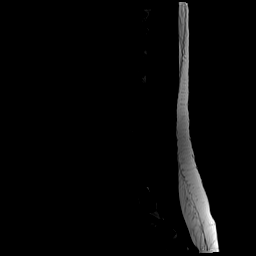
[im 15/15]
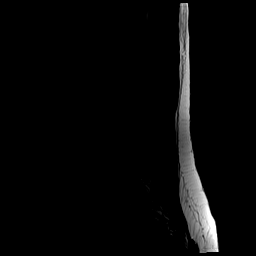

[Series 5: T1 · sagittal · 4.0mm · 1.17mm/px · 5 of 15 slices shown (1 of 2)]
[im 1/15]
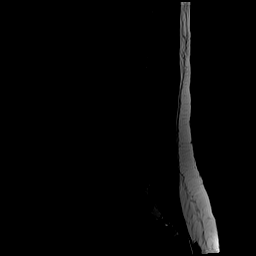
[im 4/15]
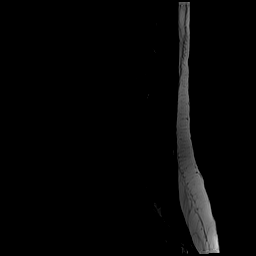
[im 8/15]
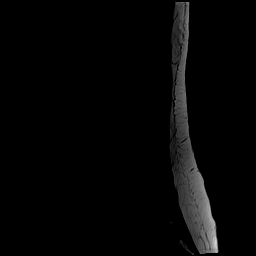
[im 11/15]
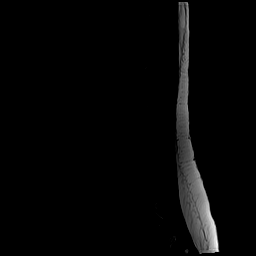
[im 15/15]
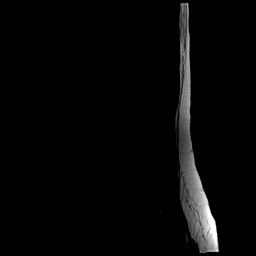

[Series 6: T2 · axial · 4.0mm · 0.39mm/px · z∈[-118,+106]mm · 10 of 46 slices shown (2 of 2)]
[im 4/46]
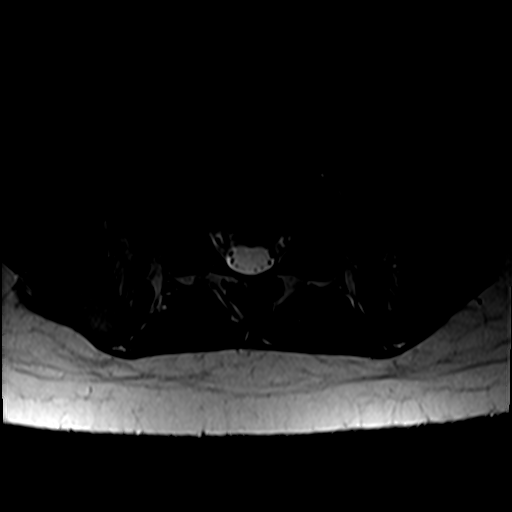
[im 7/46]
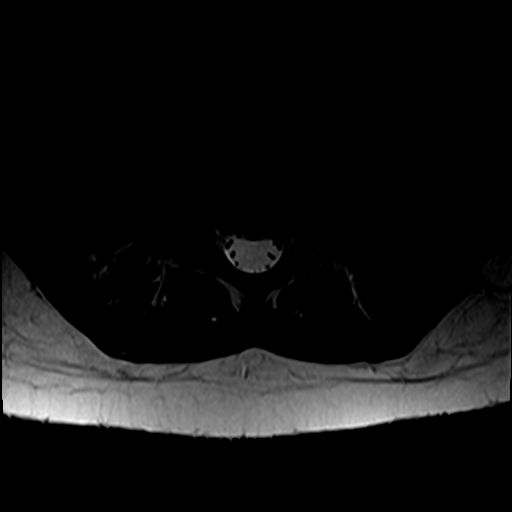
[im 10/46]
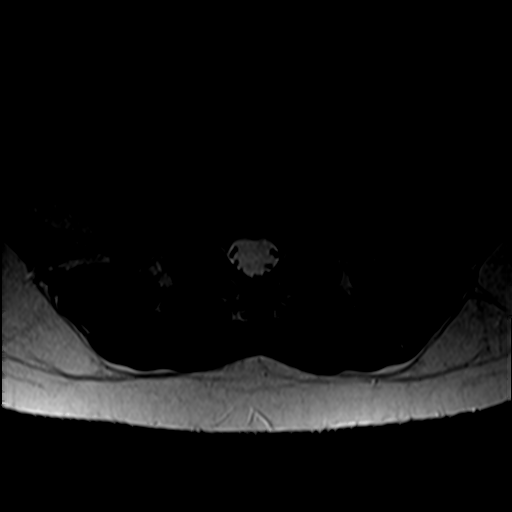
[im 16/46]
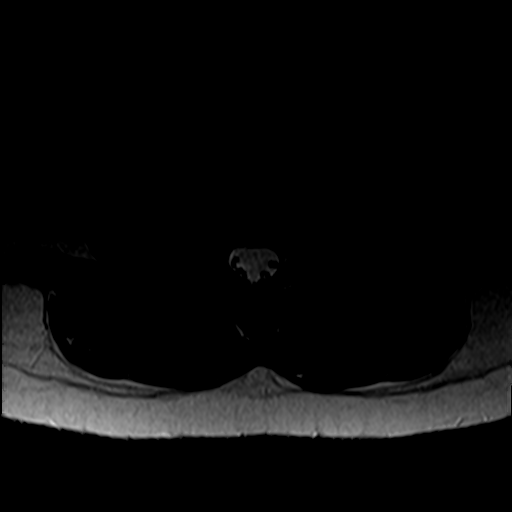
[im 22/46]
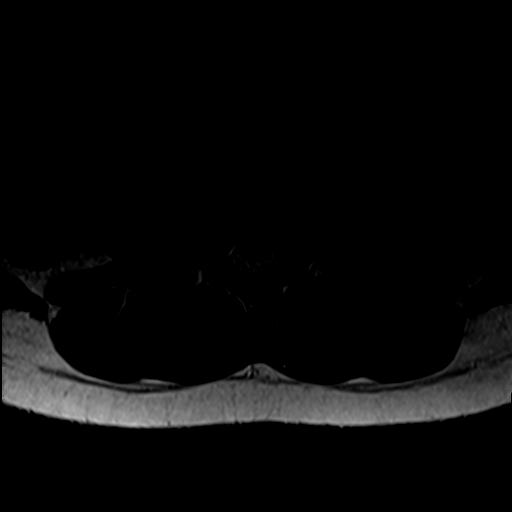
[im 25/46]
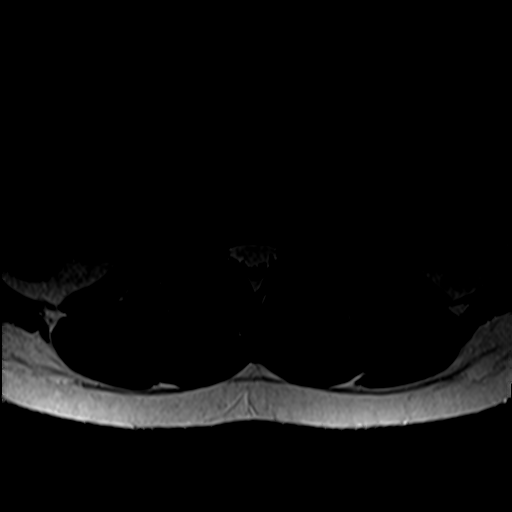
[im 28/46]
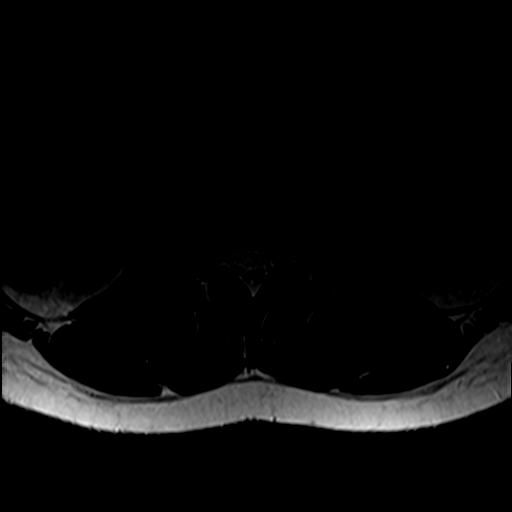
[im 34/46]
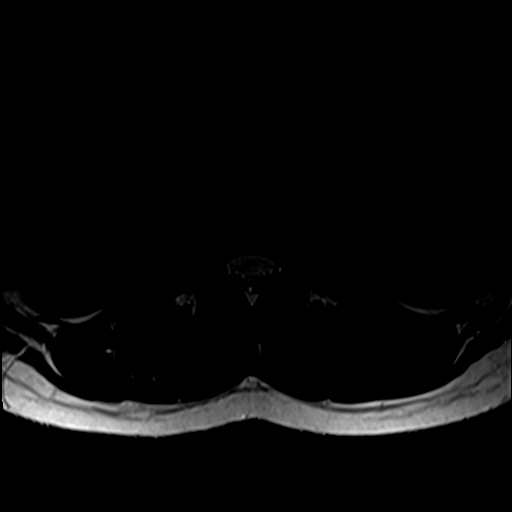
[im 40/46]
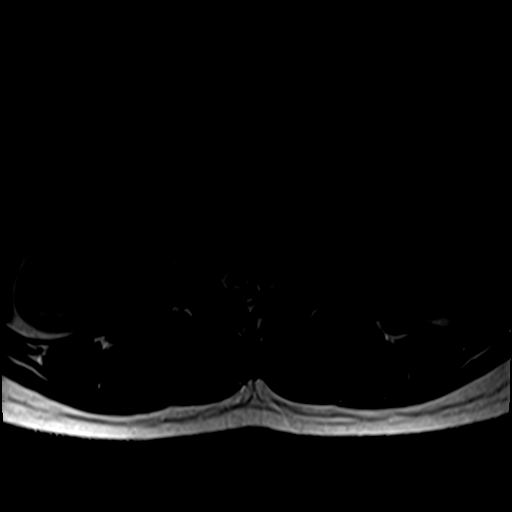
[im 46/46]
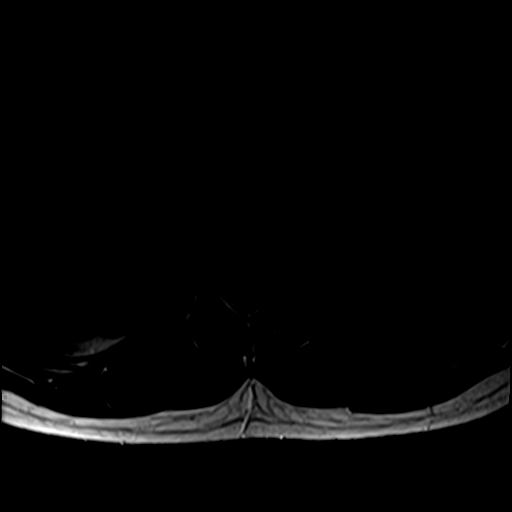

[Series 7: T1 · axial · 4.0mm · 0.39mm/px · z∈[-118,+78]mm · 6 of 46 slices shown (2 of 2)]
[im 4/46]
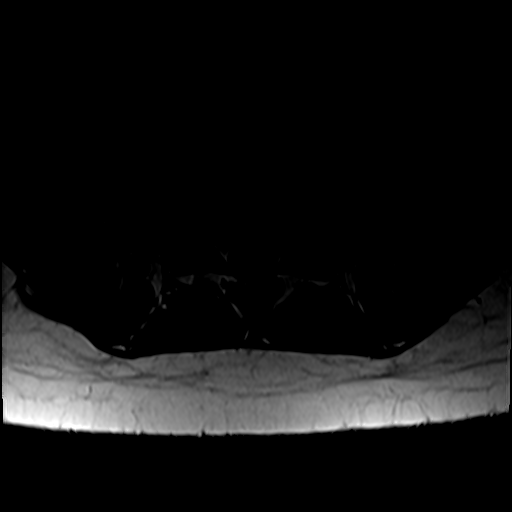
[im 7/46]
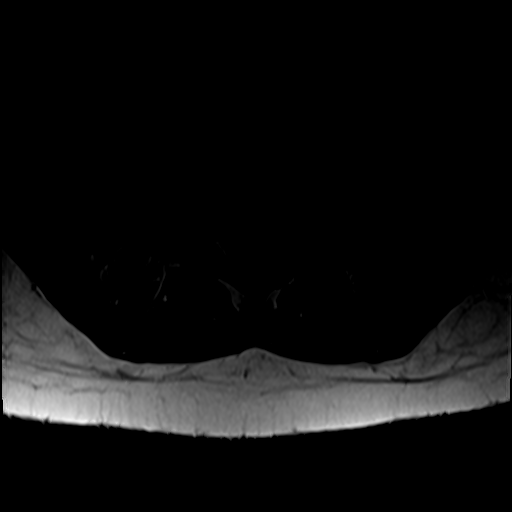
[im 10/46]
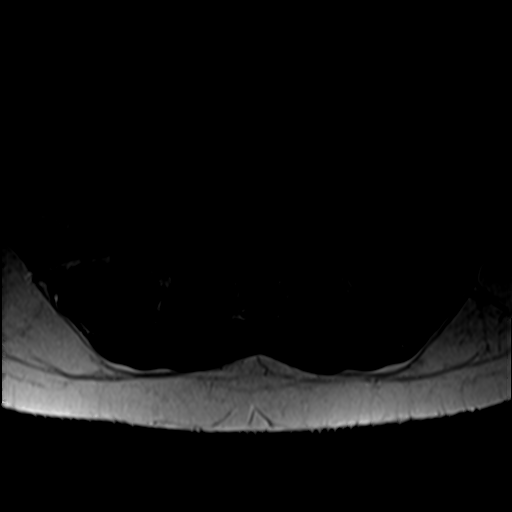
[im 16/46]
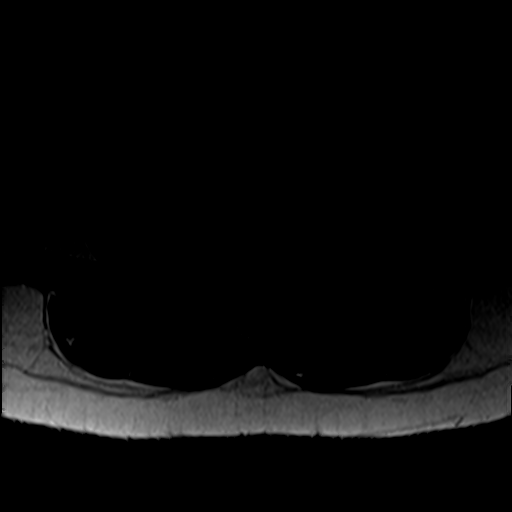
[im 25/46]
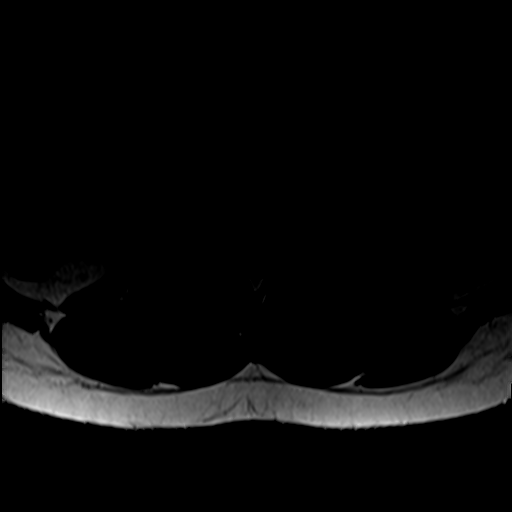
[im 40/46]
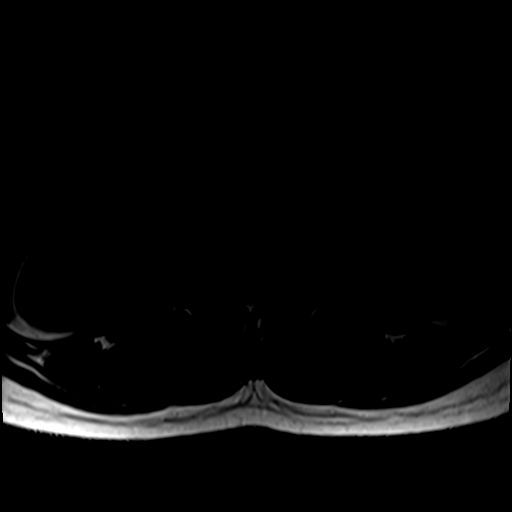

[27 of 48 positions shown; findings below may reference images not displayed]

FINDINGS: Segmentation: 5 lumbar type vertebrae based on the available
coverage

Alignment:  Normal

Vertebrae:  No fracture, evidence of discitis, or bone lesion.

Conus medullaris and cauda equina: Conus extends to the L1 level.
Conus and cauda equina appear normal. Tarlov cyst at the S2 level
with ventral scalloping of the sacrum, cyst measuring 3.2 cm.

Paraspinal and other soft tissues: Negative for perispinal mass or
inflammation

Disc levels:

L5-S1 mild disc desiccation and narrowing with shallow central
protrusion not contacting nerve roots.
IMPRESSION: 1. L5-S1 small noncompressive disc protrusion.
2. Sacral meningeal cyst with mild bony scalloping.

## 2022-08-06 MED ORDER — LAMOTRIGINE ER 200 MG PO TB24: 1.0000 | ORAL_TABLET | Freq: Two times a day (BID) | ORAL | 3 refills | Status: DC

## 2022-08-06 NOTE — Progress Notes (Signed)
NEUROLOGY FOLLOW UP OFFICE NOTE  Denise Graves [id number] 2001-01-20  HISTORY OF PRESENT ILLNESS: I had the pleasure of seeing Denise Graves in follow-up in the neurology clinic on 08/06/2022.  The patient was last seen almost a year ago for epilepsy. She is again accompanied by her mother who helps supplement the history today.  Since her last visit, she continues to do well seizure-free since 2019 on Lamotrigine ER 200mg  BID, no side effects. They deny any staring/unresponsive episodes, gaps in time, olfactory/gustatory hallucinations, rising epigastric sensation, focal numbness/tingling/weakness, myoclonic jerks. She was in the office in 08/2021 for tremor/shaking in her hands, TSH normal. These have not recurred since then. She remains very active earning 09/2021 in [place]. She denies any headaches, dizziness, vision changes, no falls. Sleep and mood are good.    History on Initial Assessment 06/11/2021: This is a pleasant 21 year old right-handed woman with a history of focal epilepsy presenting to establish adult epilepsy care. Records from her pediatric neurologist Dr. 26 were reviewed. Seizures started about 10 days before her first birthday in the setting of fever. Almost a year later she had another seizure. EEG and brain MRI in 2003 were normal. She was started on Depakene. She was seizure-free for 2 years, repeat EEG was normal, and medication was tapered and discontinued in 2006. After 3 months, she had another seizure and Depakote was restarted. She had an EEG in 08/2008 with 2-3 second parieto-occipital bursts of 2 Hz sharply contoured slow wave activity. EEG in 07/2009 showed the same pattern. Repeat EEG in 07/2010 showed frequent runs of 4 Hz occipitally predominant spike and slow wave discharge, also seen on EEG in 08/2011. She again became seizure-free and had a normal EEG in 2015, Depakote was weaned off.  In the spring of 2018, she had an onset of episodes of feeling of warmth,  dull headache, and deja vu. EEG in 06/2017 was normal. She had a 72-hour EEG with 2 episodes during sleep, one lasting 26 seconds, another lasting 10 seconds, with centrally predominant rhythmic delta-range activity in the first and sharply contoured slow-wave activity without clear field in the other. There were no clinical changes seen. Brain MRI in 08/2017 was normal. She had an increase in frequency of episodes with similar feeling of warmth in her lips, mouth, jaw, bad taste in her mouth, feeling hot and sweaty, with heart racing. Her mother witnessed her trying to speak as if her mouth were full or slightly chewing on her words. She would be completely alert during them, and following an episode she would have a bowel movement within minutes and a slight headache. In 11/2017, they were on vacation in 12/2017 when she had another similar episode that progressed to mastication, hands were clenched, eyes open but unable to communicate or respond for the first time. Her mother recalls that she woke up and said she was not feeling good, went to the bathroom but did not come out. They found her on the commode with repetitive movements in both hands, eyes glazed, with incoherent speech. She was diaphoretic and flushed in her chest. This lasted for a couple of minutes, she was confused and tired after. She had another similar seizure that day. She was started on Lamotrigine ER, dose increased to 200mg  BID with no further similar symptoms since 11/2017. She denies any focal numbness/tingling/weakness, myoclonic jerks. No headaches, dizziness, diplopia, dysarthria/dysphagia, neck/back pain, bowel/bladder dysfunction. No side effects on Lamotrigine. She usually gets 6-7 hours of restful sleep.  Mood is good. 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 Memory is okay. She lives with 2 roommates. She is driving.   Epilepsy Risk Factors:  A paternal second  cousin has epilepsy. Her father had a febrile seizure. She had febrile seizures in childhood. Otherwise she had a normal birth and early development.  There is no history of CNS infections such as meningitis/encephalitis, significant traumatic brain injury, neurosurgical procedures.  Prior AEDs: Depakote  Laboratory Data: Last [name] level in 01/2020 was 6.3    PAST MEDICAL HISTORY: Past Medical History:  Diagnosis Date   Seizures (HCC)     MEDICATIONS: Current Outpatient Medications on File Prior to Visit  Medication Sig Dispense Refill   cetirizine (ZYRTEC) 10 MG tablet Take 10 mg by mouth daily.     diazepam (VALIUM) 5 MG tablet Take 1 tablet (5 mg total) by mouth every 8 (eight) hours as needed for muscle spasms. 12 tablet 0   LamoTRIgine 200 MG TB24 24 hour tablet Take 1 tablet (200 mg total) by mouth 2 (two) times daily. 62 tablet 11   No current facility-administered medications on file prior to visit.    ALLERGIES: Allergies  Allergen Reactions   Other Rash    Crab, Atlantic White Fish   Pumpkin Seed Oil Hives and Nausea And Vomiting   Mobic [Meloxicam] Other (See Comments)    Per pt's mom-mom highly allergic and would prefer her not to ever take   Nutmeg Oil (Myristica Oil) Hives   Pistachio Nut (Diagnostic) Itching and Rash    FAMILY HISTORY: Family History  Problem Relation Age of Onset   Seizures Father        Febrile Sz   Colon cancer Maternal Grandmother        Died at age 68   Seizures Cousin        Maternal 2nd Cousin   Birth defects Cousin        2nd Cousin w an ill defined birth defect   Heart attack Maternal Grandfather    Lung cancer Paternal Grandmother     SOCIAL HISTORY: Social History   Socioeconomic History   Marital status: Single    Spouse name: Not on file   Number of children: Not on file   Years of education: Not on file   Highest education level: Not on file  Occupational History   Not on file  Tobacco Use   Smoking  status: Never   Smokeless tobacco: Never  Vaping Use   Vaping Use: Never used  Substance and Sexual Activity   Alcohol use: No   Drug use: No   Sexual activity: Never    Comment: VIRGIN  Other Topics Concern   Not on file  Social History Narrative   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   She lives with both parents. She has no siblings.   She enjoys rowing, crafting and reading books.   She attends [employer].   Right handed    Social Determinants of Health   Financial Resource Strain: Not on file  Food Insecurity: Not on file  Transportation Needs: Not on file  Physical Activity: Not on file  Stress: Not on file  Social Connections: Not on file  Intimate Partner Violence: Not on file     PHYSICAL EXAM: Vitals:   08/06/22 1130  BP: 120/75  Pulse: 66  Resp: 18  SpO2: 99%   General: No acute distress Head:  Normocephalic/atraumatic Skin/Extremities: No rash, no edema Neurological Exam: alert and awake. No aphasia or dysarthria. Fund of knowledge is appropriate.  Attention and concentration are normal.   Cranial nerves: Pupils equal, round. Extraocular movements intact with no nystagmus. Visual fields full.  No facial asymmetry.  Motor: Bulk and tone normal, muscle strength 5/5 throughout with no pronator drift.   Finger to nose testing intact.  Gait narrow-based and steady, able to tandem walk adequately.  Romberg negative.   IMPRESSION: This is a pleasant 21 yo RH woman with a history of seizures since childhood, prior EEGs suggestive of a generalized epilepsy, seizure-free off medication for 3 years until 2018 when she had recurrent episodes of deja vu, feeling of warmth, with oral and hand automatisms suggestive of focal epilepsy. EEG was non-lateralizing. She remains seizure-free since 11/2017 on Lamotrigine ER 200mg  BID, refills sent. Tremors have not recurred in almost a year. She is aware of [place] driving laws to  stop driving after a seizure until 6 months seizure-free. Follow-up in 1 year, call for any changes.    Thank you for allowing me to participate in her care.  Please do not hesitate to call for any questions or concerns.    , M.D.

## 2022-08-06 NOTE — Patient Instructions (Signed)
Good to see you doing well. Continue Lamotrigine ER 200mg  twice a day. Follow-up in 1 year. Have a good holiday!   Seizure Precautions: 1. If medication has been prescribed for you to prevent seizures, take it exactly as directed.  Do not stop taking the medicine without talking to your doctor first, even if you have not had a seizure in a long time.   2. Avoid activities in which a seizure would cause danger to yourself or to others.  Don't operate dangerous machinery, swim alone, or climb in high or dangerous places, such as on ladders, roofs, or girders.  Do not drive unless your doctor says you may.  3. If you have any warning that you may have a seizure, lay down in a safe place where you can't hurt yourself.    4.  No driving for 6 months from last seizure, as per [facility].   Please refer to the following link on the Epilepsy Foundation of America's website for more information: http://www.epilepsyfoundation.org/answerplace/Social/driving/drivingu.cfm   5.  Maintain good sleep hygiene. Avoid alcohol.  6.  Notify your neurology if you are planning pregnancy or if you become pregnant.  7.  Contact your doctor if you have any problems that may be related to the medicine you are taking.  8.  Call 911 and bring the patient back to the ED if:        A.  The seizure lasts longer than 5 minutes.       B.  The patient doesn't awaken shortly after the seizure  C.  The patient has new problems such as difficulty seeing, speaking or moving  D.  The patient was injured during the seizure  E.  The patient has a temperature over 102 F (39C)  F.  The patient vomited and now is having trouble breathing

## 2022-09-02 ENCOUNTER — Telehealth: Payer: Self-pay | Admitting: Neurology

## 2022-09-02 DIAGNOSIS — G40209 Localization-related (focal) (partial) symptomatic epilepsy and epileptic syndromes with complex partial seizures, not intractable, without status epilepticus: Secondary | ICD-10-CM

## 2022-09-02 DIAGNOSIS — R569 Unspecified convulsions: Secondary | ICD-10-CM

## 2022-09-02 NOTE — Telephone Encounter (Signed)
Pls ask if anything has been going on, poor sleep, increased stress, alcohol. Ok to check lamictal level, pls advise it needs to be first thing in the morning before she takes her med. Thanks

## 2022-09-02 NOTE — Telephone Encounter (Signed)
Pt's mother called in stating the pt just got back home from school and reported to her she has been having some "funny" feelings. They are wanting to see if maybe she might need to have some blood work done so they can get ahead of any potential problem.

## 2022-09-02 NOTE — Telephone Encounter (Signed)
Pt was sleeping spoke with her mother who stated that the patient had told her she had, had lack of sleep and increase in stress because of finals and because of playing sports. She has not missed any of her medication she takes it on time, no alcohol intake, lab order placed in epic, they will come in the morning to have it done

## 2022-09-02 NOTE — Addendum Note (Signed)
Addended by: Dimas Chyle on: 09/02/2022 12:33 PM   Modules accepted: Orders

## 2022-09-03 ENCOUNTER — Other Ambulatory Visit (INDEPENDENT_AMBULATORY_CARE_PROVIDER_SITE_OTHER): Payer: 59

## 2022-09-03 DIAGNOSIS — G40209 Localization-related (focal) (partial) symptomatic epilepsy and epileptic syndromes with complex partial seizures, not intractable, without status epilepticus: Secondary | ICD-10-CM | POA: Diagnosis not present

## 2022-09-03 DIAGNOSIS — R569 Unspecified convulsions: Secondary | ICD-10-CM

## 2022-09-05 LAB — LAMOTRIGINE LEVEL: Lamotrigine Lvl: 7 ug/mL (ref 2.5–15.0)

## 2022-09-06 ENCOUNTER — Telehealth: Payer: Self-pay | Admitting: Neurology

## 2022-09-06 DIAGNOSIS — R569 Unspecified convulsions: Secondary | ICD-10-CM

## 2022-09-06 MED ORDER — LAMOTRIGINE ER 50 MG PO TB24: ORAL_TABLET | ORAL | 1 refills | Status: DC

## 2022-09-06 NOTE — Telephone Encounter (Signed)
Patient called in requesting to make an urgent appt. She said christy sent her to the front office to make an appt and no one would pick up. I told her I would have to wait for christy and/or dr.aquino to let me know what day and time. I can't use urgent slots with out her permission.

## 2022-09-06 NOTE — Telephone Encounter (Signed)
Pt's mother called in and left a message with the access nurse about the pt's lab results

## 2022-09-06 NOTE — Telephone Encounter (Signed)
-----   Message from Van Clines, MD sent at 09/05/2022  9:40 PM EST ----- Pls let them know that [name] level is within range, it is close to prior level. Has she had the same feelings? If yes, we can increase dose a little to Lamotrigine ER 250mg  BID (she takes a 200mg  tab with a Lamotrigine ER 50mg  tab BID - pls send Rx for 50mg  tab), recheck level in 2 weeks. If no further symptoms with improvement in sleep and stress, can stay on same dose. Thanks

## 2022-09-06 NOTE — Telephone Encounter (Signed)
Spoke with pt mom they wanted a sooner appointment she is scheduled for Jan 5th at 1pm with Dr Karel Jarvis, they where advised that [name] level is within range, it is close to prior level. Has she had the same feelings? Yes see phone note with Neysa Bonito  If yes, we can increase dose a little to Lamotrigine ER 250mg  BID (she takes a 200mg  tab with a Lamotrigine ER 50mg  tab BID recheck level in 2 weeks. Lab work ordered in . New RX sent to pharmacy

## 2022-09-06 NOTE — Telephone Encounter (Signed)
Caller called in and stated she talked to the Access nurse over the weekend. Patient had a glimpse moment over the weekend and access nurse, said she needed to have an office visit. Will see what is needed

## 2022-09-06 NOTE — Telephone Encounter (Signed)
Advised of normal labs.

## 2022-09-08 ENCOUNTER — Telehealth: Payer: Self-pay

## 2022-09-08 ENCOUNTER — Other Ambulatory Visit (HOSPITAL_COMMUNITY): Payer: Self-pay

## 2022-09-08 NOTE — Telephone Encounter (Signed)
Pharmacy Patient Advocate Encounter   Received notification from [employer] that prior authorization for lamoTRIgine ER 50MG  er tablets is required/requested.    PA submitted on 09/08/2022 via CoverMyMeds  Key 09/10/2022  Status is pending

## 2022-09-14 NOTE — Telephone Encounter (Signed)
Left VM on mother's number to check in on how [name] is doing on increased dose

## 2022-09-14 NOTE — Telephone Encounter (Signed)
Pharmacy Patient Advocate Encounter  Prior Authorization for lamoTRIgine ER 50MG  er tablets has been approved.    PA# PA Case ID:  Effective dates: 09/08/2022 through 09/09/2023

## 2022-09-15 NOTE — Telephone Encounter (Signed)
Spoke to patient/mother, she is doing well on the increased dose, due to [name] level on 1/3. No further symptoms, no side effects. F/u as scheduled 09/24/22.

## 2022-09-22 ENCOUNTER — Other Ambulatory Visit: Payer: 59

## 2022-09-22 DIAGNOSIS — R569 Unspecified convulsions: Secondary | ICD-10-CM

## 2022-09-24 ENCOUNTER — Ambulatory Visit (INDEPENDENT_AMBULATORY_CARE_PROVIDER_SITE_OTHER): Payer: 59 | Admitting: Neurology

## 2022-09-24 ENCOUNTER — Encounter: Payer: Self-pay | Admitting: Neurology

## 2022-09-24 ENCOUNTER — Other Ambulatory Visit: Payer: Self-pay

## 2022-09-24 VITALS — BP 117/70 | HR 64 | Ht 72.0 in | Wt 185.4 lb

## 2022-09-24 DIAGNOSIS — G40209 Localization-related (focal) (partial) symptomatic epilepsy and epileptic syndromes with complex partial seizures, not intractable, without status epilepticus: Secondary | ICD-10-CM

## 2022-09-24 LAB — LAMOTRIGINE LEVEL: Lamotrigine Lvl: 8.2 ug/mL (ref 2.5–15.0)

## 2022-09-24 NOTE — Progress Notes (Unsigned)
NEUROLOGY FOLLOW UP OFFICE NOTE  Denise Graves [id number] 08-12-2001  HISTORY OF PRESENT ILLNESS: I had the pleasure of seeing Denise Graves in follow-up in the neurology clinic on 09/24/2021.  The patient was last seen 2 months ago for epilepsy. She is again accompanied by her mother who helps supplement the history today.  Records and images were personally reviewed where available. She presents for an earlier visit due to a change in symptoms. Her mother brings a diary of her symptoms. On 11/19, she was at home doing homework and reported clammy hands, flush/hot head feeling. It lasted 20 seconds, she was coherent. On 12/13, she was at home watching TV when she felt weird for 5-10 seconds. On 12/14 she was standing in the den and stated something was wrong. She felt funny, clammy hands, hot forehead area for 30 seconds. Both these times occurred on her Finals week with lack of sleep, loud neighbors, high stress. Her mother called our office, Lamotrigine level done 12/15 was 7, dose increased to 250mg  BID on 12/18. On 1/4, she was standing in the kitchen when said all of a sudden hands felt clammy, head was hot. This lasted 10-15 seconds. She had been rested with no clear stressors. She was coherent with all of them, no staring/unresponsiveness. She was on her period during the episodes in December, she is due for her period next week. She is overall tolerating the Lamotrigine ER 250mg  BID dose, it may be affecting sleep, she is sleeping later in the evening. Her mother reports that in the past, deja vu would be a consistent symptom, but that she would also report a hot/flushed feeling.    History on Initial Assessment 06/11/2021: This is a pleasant 22 year old right-handed woman with a history of focal epilepsy presenting to establish adult epilepsy care. Records from her pediatric neurologist Dr. 06/13/2021 were reviewed. Seizures started about 10 days before her first birthday in the setting of fever.  Almost a year later she had another seizure. EEG and brain MRI in 2003 were normal. She was started on Depakene. She was seizure-free for 2 years, repeat EEG was normal, and medication was tapered and discontinued in 2006. After 3 months, she had another seizure and Depakote was restarted. She had an EEG in 08/2008 with 2-3 second parieto-occipital bursts of 2 Hz sharply contoured slow wave activity. EEG in 07/2009 showed the same pattern. Repeat EEG in 07/2010 showed frequent runs of 4 Hz occipitally predominant spike and slow wave discharge, also seen on EEG in 08/2011. She again became seizure-free and had a normal EEG in 2015, Depakote was weaned off.  In the spring of 2018, she had an onset of episodes of feeling of warmth, dull headache, and deja vu. EEG in 06/2017 was normal. She had a 72-hour EEG with 2 episodes during sleep, one lasting 26 seconds, another lasting 10 seconds, with centrally predominant rhythmic delta-range activity in the first and sharply contoured slow-wave activity without clear field in the other. There were no clinical changes seen. Brain MRI in 08/2017 was normal. She had an increase in frequency of episodes with similar feeling of warmth in her lips, mouth, jaw, bad taste in her mouth, feeling hot and sweaty, with heart racing. Her mother witnessed her trying to speak as if her mouth were full or slightly chewing on her words. She would be completely alert during them, and following an episode she would have a bowel movement within minutes and a slight headache. In  11/2017, they were on vacation in [place] when she had another similar episode that progressed to mastication, hands were clenched, eyes open but unable to communicate or respond for the first time. Her mother recalls that she woke up and said she was not feeling good, went to the bathroom but did not come out. They found her on the commode with repetitive movements in both hands, eyes glazed, with incoherent speech. She  was diaphoretic and flushed in her chest. This lasted for a couple of minutes, she was confused and tired after. She had another similar seizure that day. She was started on Lamotrigine ER, dose increased to 200mg  BID with no further similar symptoms since 11/2017. She denies any focal numbness/tingling/weakness, myoclonic jerks. No headaches, dizziness, diplopia, dysarthria/dysphagia, neck/back pain, bowel/bladder dysfunction. No side effects on Lamotrigine. She usually gets 6-7 hours of restful sleep. Mood is good. 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 Memory is okay. She lives with 2 roommates. She is driving.   Epilepsy Risk Factors:  A paternal second cousin has epilepsy. Her father had a febrile seizure. She had febrile seizures in childhood. Otherwise she had a normal birth and early development.  There is no history of CNS infections such as meningitis/encephalitis, significant traumatic brain injury, neurosurgical procedures.  Prior AEDs: Depakote  Laboratory Data: Last [name] level in 01/2020 was 6.3    PAST MEDICAL HISTORY: Past Medical History:  Diagnosis Date   Seizures ([place])     MEDICATIONS: Current Outpatient Medications on File Prior to Visit  Medication Sig Dispense Refill   cetirizine (ZYRTEC) 10 MG tablet Take 10 mg by mouth daily.     lamoTRIgine ([name] XR) 50 MG 24 hour tablet Take with 200 mg tablets to equal 250 mg BID 180 tablet 1   LamoTRIgine 200 MG TB24 24 hour tablet Take 1 tablet (200 mg total) by mouth 2 (two) times daily. 180 tablet 3   No current facility-administered medications on file prior to visit.    ALLERGIES: Allergies  Allergen Reactions   Other Rash    Crab, Atlantic White Fish   Pumpkin Seed Oil Hives and Nausea And Vomiting   Mobic [Meloxicam] Other (See Comments)    Per pt's mom-mom highly allergic and would prefer her not to ever take   Nutmeg Oil (Myristica  Oil) Hives   Pistachio Nut (Diagnostic) Itching and Rash    FAMILY HISTORY: Family History  Problem Relation Age of Onset   Seizures Father        Febrile Sz   Colon cancer Maternal Grandmother        Died at age 23   Seizures Cousin        Maternal 2nd [place]   Birth defects Cousin        2nd [place] w an ill defined birth defect   Heart attack Maternal Grandfather    Lung cancer Paternal Grandmother     SOCIAL HISTORY: Social History   Socioeconomic History   Marital status: Single    Spouse name: Not on file   Number of children: Not on file   Years of education: Not on file   Highest education level: Not on file  Occupational History   Not on file  Tobacco Use   Smoking status: Never   Smokeless tobacco: Never  Vaping Use   Vaping Use: Never used  Substance and Sexual Activity   Alcohol use: No  Drug use: No   Sexual activity: Never    Comment: VIRGIN  Other Topics Concern   Not on file  Social History Narrative   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    She was home schooled-[employer].   She lives with both parents. She has no siblings.   She enjoys rowing, crafting and reading books.   She attends [employer].   Right handed    Social Determinants of Health   Financial Resource Strain: Not on file  Food Insecurity: Not on file  Transportation Needs: Not on file  Physical Activity: Not on file  Stress: Not on file  Social Connections: Not on file  Intimate Partner Violence: Not on file     PHYSICAL EXAM: Vitals:   09/24/22 1251  BP: 117/70  Pulse: 64  SpO2: 98%   General: No acute distress Head:  Normocephalic/atraumatic Skin/Extremities: No rash, no edema Neurological Exam: alert and oriented to person, place, and time. No aphasia or dysarthria. Fund of knowledge is appropriate.  Recent and remote memory are intact.  Attention and concentration are normal.   Cranial nerves: Pupils equal, round. Extraocular movements intact  with no nystagmus. Visual fields full.  No facial asymmetry.  Motor: Bulk and tone normal, muscle strength 5/5 throughout with no pronator drift.   Finger to nose testing intact.  Gait narrow-based and steady, able to tandem walk adequately.  Romberg negative.   IMPRESSION: This is a pleasant 22 yo RH woman with a history of seizures since childhood, prior EEGs suggestive of a generalized epilepsy, seizure-free off medication for 3 years until 2018 when she had recurrent episodes of deja vu, feeling of warmth, with oral and hand automatisms suggestive of focal epilepsy. EEG was non-lateralizing. She remains seizure-free since 11/2017 on Lamotrigine ER 200mg  BID, refills sent. Tremors have not recurred in almost a year. She is aware of [place] driving laws to stop driving after a seizure until 6 months seizure-free. Follow-up in 1 year, call for any changes.     Thank you for allowing me to participate in *** care.  Please do not hesitate to call for any questions or concerns.  The duration of this appointment visit was *** minutes of face-to-face time with the patient.  Greater than 50% of this time was spent in counseling, explanation of diagnosis, planning of further management, and coordination of care.   Ellouise Newer, M.D.   CC: ***

## 2022-09-24 NOTE — Patient Instructions (Signed)
Good to see you. Continue your symptom diary and try to note any triggers (sleep, menstrual period, stress levels). Once [name] level returns, we will discuss next steps with your dose. May consider prolonged EEG in the future. Follow-up in 3 months, call for any changes.    Seizure Precautions: 1. If medication has been prescribed for you to prevent seizures, take it exactly as directed.  Do not stop taking the medicine without talking to your doctor first, even if you have not had a seizure in a long time.   2. Avoid activities in which a seizure would cause danger to yourself or to others.  Don't operate dangerous machinery, swim alone, or climb in high or dangerous places, such as on ladders, roofs, or girders.  Do not drive unless your doctor says you may.  3. If you have any warning that you may have a seizure, lay down in a safe place where you can't hurt yourself.    4.  No driving for 6 months from last seizure, as per [facility].   Please refer to the following link on the [facility] website for more information: http://www.epilepsyfoundation.org/answerplace/Social/driving/drivingu.cfm   5.  Maintain good sleep hygiene. Avoid alcohol.  6.  Notify your neurology if you are planning pregnancy or if you become pregnant.  7.  Contact your doctor if you have any problems that may be related to the medicine you are taking.  8.  Call 911 and bring the patient back to the ED if:        A.  The seizure lasts longer than 5 minutes.       B.  The patient doesn't awaken shortly after the seizure  C.  The patient has new problems such as difficulty seeing, speaking or moving  D.  The patient was injured during the seizure  E.  The patient has a temperature over 102 F (39C)  F.  The patient vomited and now is having trouble breathing

## 2022-09-27 ENCOUNTER — Telehealth: Payer: Self-pay | Admitting: Neurology

## 2022-09-27 NOTE — Telephone Encounter (Signed)
Spoke to her mother. Discussed lamictal level 8.2. We had discussed increasing dose to 300mg  BID if level is mid-range, since she still had symptoms on 250mg  BID. Mother reports though that in the past week, Taralyn has been reporting an upset stomach after taking the medication. She notices within a few hours of taking medication, her stomach hurts. even in the middle of the night once asleep stomach is hurting. Not nausea, stomach feels crampy but she may also be starting her period. We agreed to hold off on any changes for now, Jeani Hawking with speak to Tomeka and see what exactly she is feeling. Discussed options to either stay on 250mg  BID and see how things settle as she gets more used to medication, or increase to 300mg  BID and monitor stomach issues. Mother will update tomorrow.

## 2022-10-15 ENCOUNTER — Telehealth: Payer: Self-pay

## 2022-10-15 ENCOUNTER — Encounter: Payer: Self-pay | Admitting: Neurology

## 2022-10-15 DIAGNOSIS — R569 Unspecified convulsions: Secondary | ICD-10-CM

## 2022-10-15 MED ORDER — LAMOTRIGINE ER 300 MG PO TB24: ORAL_TABLET | ORAL | 3 refills | Status: DC

## 2022-10-15 NOTE — Addendum Note (Signed)
Addended by: Jake Seats on: 10/15/2022 03:36 PM   Modules accepted: Orders

## 2022-10-15 NOTE — Telephone Encounter (Signed)
Pls let mom/patient know I sent in the increase in Lamotrigine ER, it is a 300mg  tablet: take 1 tablet twice a day. Would like to recheck [name] level in 2 weeks, call us if she gets very dizzy on higher dose. Thanks

## 2022-10-15 NOTE — Telephone Encounter (Signed)
Dr.Aquino up dosage of LamoTRIgine 200 MG TB24 24 hour tablet, due to patient having stomach issues they were worried and Dr.Aquino told mom to call back if this is what they want to do. Mother called and said everything has calmed down and if Dr.Aquino still feels she still wants to up the dosage that Lajuana would like to up the dose.

## 2022-10-15 NOTE — Telephone Encounter (Signed)
Spoke with pt mom informed her that Dr Delice Lesch  sent in the increase in Lamotrigine ER, it is a 300mg  tablet: take 1 tablet twice a day. Would like to recheck [name] level in 2 weeks, call us if she gets very dizzy on higher dose

## 2022-10-29 ENCOUNTER — Other Ambulatory Visit (INDEPENDENT_AMBULATORY_CARE_PROVIDER_SITE_OTHER): Payer: Self-pay

## 2022-10-29 DIAGNOSIS — R569 Unspecified convulsions: Secondary | ICD-10-CM

## 2022-11-01 LAB — LAMOTRIGINE LEVEL: Lamotrigine Lvl: 9.1 ug/mL (ref 2.5–15.0)

## 2022-11-04 ENCOUNTER — Telehealth: Payer: Self-pay

## 2022-11-04 NOTE — Telephone Encounter (Signed)
-----   Message from Cameron Sprang, MD sent at 11/03/2022  3:45 PM EST ----- Pls let them know [name] level is higher, still within range, continue current dose. Thanks

## 2022-11-04 NOTE — Telephone Encounter (Signed)
Pt called an informed [name] level is higher, still within range, continue current dose

## 2022-12-31 ENCOUNTER — Encounter: Payer: Self-pay | Admitting: Neurology

## 2022-12-31 ENCOUNTER — Ambulatory Visit: Payer: 59 | Admitting: Neurology

## 2022-12-31 VITALS — BP 120/68 | HR 66 | Ht 72.0 in | Wt 189.6 lb

## 2022-12-31 DIAGNOSIS — G40209 Localization-related (focal) (partial) symptomatic epilepsy and epileptic syndromes with complex partial seizures, not intractable, without status epilepticus: Secondary | ICD-10-CM | POA: Diagnosis not present

## 2022-12-31 MED ORDER — LAMOTRIGINE ER 300 MG PO TB24: ORAL_TABLET | ORAL | 3 refills | Status: DC

## 2022-12-31 NOTE — Progress Notes (Signed)
NEUROLOGY FOLLOW UP OFFICE NOTE  Denise Graves Graves [id number] 2000/10/24  HISTORY OF PRESENT ILLNESS: I had the pleasure of seeing Denise Graves Graves in follow-up in the neurology clinic on 12/31/2022.  The patient was last seen 3 months ago for epilepsy. She is again accompanied by her mother who helps supplement the history today.  Records and images were personally reviewed where available.  On her last visit, they were reporting episodes where she felt funny/clammy/hot. Lamotrigine ER dose had been increased for these symptoms, [name] level on 250mg  BID was 8.2. Dose increased to 300mg  BID with level of 9/1 in 10/2022. No further similar symptoms since January. She is doing well. No seizures or seizure-like symptoms. No staring/unresponsive episodes, gaps in time, olfactory/gustatory hallucinations, focal numbness/tingling/weakness, myoclonic jerks, or tremors. No headaches, dizziness, vision changes, no falls. Sleep is good.    History on Initial Assessment 06/11/2021: This is a pleasant 22 year old right-handed woman with a history of focal epilepsy presenting to establish adult epilepsy care. Records from her pediatric neurologist Dr. Sharene Skeans were reviewed. Seizures started about 10 days before her first birthday in the setting of fever. Almost a year later she had another seizure. EEG and brain MRI in 2003 were normal. She was started on Depakene. She was seizure-free for 2 years, repeat EEG was normal, and medication was tapered and discontinued in 2006. After 3 months, she had another seizure and Depakote was restarted. She had an EEG in 08/2008 with 2-3 second parieto-occipital bursts of 2 Hz sharply contoured slow wave activity. EEG in 07/2009 showed the same pattern. Repeat EEG in 07/2010 showed frequent runs of 4 Hz occipitally predominant spike and slow wave discharge, also seen on EEG in 08/2011. She again became seizure-free and had a normal EEG in 2015, Depakote was weaned off.  In the spring of  2018, she had an onset of episodes of feeling of warmth, dull headache, and deja vu. EEG in 06/2017 was normal. She had a 72-hour EEG with 2 episodes during sleep, one lasting 26 seconds, another lasting 10 seconds, with centrally predominant rhythmic delta-range activity in the first and sharply contoured slow-wave activity without clear field in the other. There were no clinical changes seen. Brain MRI in 08/2017 was normal. She had an increase in frequency of episodes with similar feeling of warmth in her lips, mouth, jaw, bad taste in her mouth, feeling hot and sweaty, with heart racing. Her mother witnessed her trying to speak as if her mouth were full or slightly chewing on her words. She would be completely alert during them, and following an episode she would have a bowel movement within minutes and a slight headache. In 11/2017, they were on vacation in [place] when she had another similar episode that progressed to mastication, hands were clenched, eyes open but unable to communicate or respond for the first time. Her mother recalls that she woke up and said she was not feeling good, went to the bathroom but did not come out. They found her on the commode with repetitive movements in both hands, eyes glazed, with incoherent speech. She was diaphoretic and flushed in her chest. This lasted for a couple of minutes, she was confused and tired after. She had another similar seizure that day. She was started on Lamotrigine ER, dose increased to 200mg  BID with no further similar symptoms since 11/2017. She denies any focal numbness/tingling/weakness, myoclonic jerks. No headaches, dizziness, diplopia, dysarthria/dysphagia, neck/back pain, bowel/bladder dysfunction. No side effects on Lamotrigine. She  usually gets 6-7 hours of restful sleep. Mood is good. 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 Memory is okay. She lives with 2 roommates. She is  driving.   Epilepsy Risk Factors:  A paternal second cousin has epilepsy. Her father had a febrile seizure. She had febrile seizures in childhood. Otherwise she had a normal birth and early development.  There is no history of CNS infections such as meningitis/encephalitis, significant traumatic brain injury, neurosurgical procedures.  Prior AEDs: Depakote  Laboratory Data: Last [name] level in 01/2020 was 6.3  PAST MEDICAL HISTORY: Past Medical History:  Diagnosis Date   Seizures (HCC)     MEDICATIONS: Current Outpatient Medications on File Prior to Visit  Medication Sig Dispense Refill   cetirizine (ZYRTEC) 10 MG tablet Take 10 mg by mouth daily.     LamoTRIgine 300 MG TB24 24 hour tablet Take 1 tablet twice a day 180 tablet 3   No current facility-administered medications on file prior to visit.    ALLERGIES: Allergies  Allergen Reactions   Other Rash    Crab, Atlantic White Fish   Pumpkin Seed Oil Hives and Nausea And Vomiting   Mobic [Meloxicam] Other (See Comments)    Per pt's mom-mom highly allergic and would prefer her not to ever take   Nutmeg Oil (Myristica Oil) Hives   Pistachio Nut (Diagnostic) Itching and Rash    FAMILY HISTORY: Family History  Problem Relation Age of Onset   Seizures Father        Febrile Sz   Colon cancer Maternal Grandmother        Died at age 15   Seizures Cousin        Maternal 2nd Cousin   Birth defects Cousin        2nd Cousin w an ill defined birth defect   Heart attack Maternal Grandfather    Lung cancer Paternal Grandmother     SOCIAL HISTORY: Social History   Socioeconomic History   Marital status: Single    Spouse name: Not on file   Number of children: Not on file   Years of education: Not on file   Highest education level: Not on file  Occupational History   Not on file  Tobacco Use   Smoking status: Never   Smokeless tobacco: Never  Vaping Use   Vaping Use: Never used  Substance and Sexual Activity    Alcohol use: No   Drug use: No   Sexual activity: Never    Comment: [place]  Other Topics Concern   Not on file  Social History Narrative   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   She lives with both parents. She has no siblings.   She enjoys rowing, crafting and reading books.   She attends [employer].   Right handed    Social Determinants of Health   Financial Resource Strain: Not on file  Food Insecurity: Not on file  Transportation Needs: Not on file  Physical Activity: Not on file  Stress: Not on file  Social Connections: Not on file  Intimate Partner Violence: Not on file     PHYSICAL EXAM: Vitals:   12/31/22 1542 12/31/22 1608  BP: (!) 145/55 120/68  Pulse: 66   SpO2: 97%    General: No acute distress Head:  Normocephalic/atraumatic Skin/Extremities: No rash, no edema Neurological Exam: alert and awake.  No aphasia or dysarthria. Fund of knowledge is appropriate.  Attention and concentration are normal.   Cranial nerves: Pupils equal, round. Extraocular movements intact with no nystagmus. Visual fields full.  No facial asymmetry.  Motor: Bulk and tone normal, muscle strength 5/5 throughout with no pronator drift.   Finger to nose testing intact.  Gait narrow-based and steady, able to tandem walk adequately.  Romberg negative.   IMPRESSION: This is a pleasant 22 yo RH woman with a history of seizures since childhood, prior EEGs suggestive of a generalized epilepsy, seizure-free off medication for 3 years until 2018 when she had recurrent episodes of deja vu, feeling of warmth, with oral and hand automatisms suggestive of focal epilepsy. EEG was non-lateralizing. She was having episodes of feeling hot/flushed/clammy, and weird. Lamotrigine ER dose increased, she is on  BID with no further similar symptoms since January 2024. Refills sent. She is aware of [place] driving laws to stop driving after a seizure until 6 months  seizure-free. Follow-up in 6 months, call for any changes.    Thank you for allowing me to participate in her care.  Please do not hesitate to call for any questions or concerns.    Patrcia Dolly, M.D.

## 2022-12-31 NOTE — Patient Instructions (Signed)
Good to see you doing well. Continue Lamotrigine ER 300mg  twice a day. Follow-up in 6 months, call for any changes.    Seizure Precautions: 1. If medication has been prescribed for you to prevent seizures, take it exactly as directed.  Do not stop taking the medicine without talking to your doctor first, even if you have not had a seizure in a long time.   2. Avoid activities in which a seizure would cause danger to yourself or to others.  Don't operate dangerous machinery, swim alone, or climb in high or dangerous places, such as on ladders, roofs, or girders.  Do not drive unless your doctor says you may.  3. If you have any warning that you may have a seizure, lay down in a safe place where you can't hurt yourself.    4.  No driving for 6 months from last seizure, as per [facility].   Please refer to the following link on the Epilepsy Foundation of [employer]'s website for more information: http://www.epilepsyfoundation.org/answerplace/Social/driving/drivingu.cfm   5.  Maintain good sleep hygiene. Avoid alcohol.  6.  Notify your neurology if you are planning pregnancy or if you become pregnant.  7.  Contact your doctor if you have any problems that may be related to the medicine you are taking.  8.  Call 911 and bring the patient back to the ED if:        A.  The seizure lasts longer than 5 minutes.       B.  The patient doesn't awaken shortly after the seizure  C.  The patient has new problems such as difficulty seeing, speaking or moving  D.  The patient was injured during the seizure  E.  The patient has a temperature over 102 F (39C)  F.  The patient vomited and now is having trouble breathing

## 2023-05-04 ENCOUNTER — Ambulatory Visit: Payer: 59 | Admitting: Podiatry

## 2023-05-04 ENCOUNTER — Encounter: Payer: Self-pay | Admitting: Podiatry

## 2023-05-04 DIAGNOSIS — L720 Epidermal cyst: Secondary | ICD-10-CM | POA: Insufficient documentation

## 2023-05-04 DIAGNOSIS — M722 Plantar fascial fibromatosis: Secondary | ICD-10-CM

## 2023-05-04 MED ORDER — TRIAMCINOLONE ACETONIDE 40 MG/ML IJ SUSP
20.0000 mg | Freq: Once | INTRAMUSCULAR | Status: AC
  Administered 2023-05-04: 20 mg

## 2023-05-04 NOTE — Progress Notes (Signed)
She presents today for flareup of her plantar fasciitis of her left foot.  She states that is less than the heel but more of my arch.  She states this come back and is really bothersome.  The shock wave really helped a lot the last time which was 2022.  She is a Engineer, maintenance (IT) and would like to see if there is any way to keep this from hurting more permanently particularly when she is rowing.  She is also complaining of pain to her toes particularly these 2 as she points to the first and second on both feet states that the left one hurts a little bit worse.  Denies any trauma.  Objective: Vital signs are stable alert and oriented x 3.  Pulses are palpable.  There is no erythema edema cellulitis drainage or odor she has a cavus foot deformity with mild hammertoe deformity #2 and #3 of the bilateral foot.  The PIPJ's are little more rigid and will not extend to 180 degrees.  She also has tenderness on palpation of the medial longitudinal arch just distal to its insertion on the calcaneus.  Chronic proximal Planter fasciitis and midfoot fasciitis is elicitable.  Assessment: Planter fasciitis and hammertoe deformity left foot over the right.  Plan: Discussed etiology pathology conservative surgical therapies.  We are going to have her scanned and Bethann Berkshire will order a fabrication for her orthotics.  They need to be as thin as possible possibly graphite intrinsic heel and intrinsic forefoot most likely just metatarsal length and they will need to fit in her rowing shoes.  We did inject the medial longitudinal arch today with 10 mg of Kenalog and local anesthetic.  And we will follow-up with her once his orthotics come in.

## 2023-07-10 ENCOUNTER — Emergency Department (HOSPITAL_BASED_OUTPATIENT_CLINIC_OR_DEPARTMENT_OTHER)
Admission: EM | Admit: 2023-07-10 | Discharge: 2023-07-10 | Disposition: A | Discharge status: Home / Self Care | Payer: 59 | Attending: Emergency Medicine | Admitting: Emergency Medicine

## 2023-07-10 ENCOUNTER — Encounter (HOSPITAL_BASED_OUTPATIENT_CLINIC_OR_DEPARTMENT_OTHER): Payer: Self-pay

## 2023-07-10 ENCOUNTER — Emergency Department (HOSPITAL_BASED_OUTPATIENT_CLINIC_OR_DEPARTMENT_OTHER): Payer: 59

## 2023-07-10 ENCOUNTER — Other Ambulatory Visit: Payer: Self-pay

## 2023-07-10 ENCOUNTER — Emergency Department (HOSPITAL_BASED_OUTPATIENT_CLINIC_OR_DEPARTMENT_OTHER): Payer: 59 | Admitting: Radiology

## 2023-07-10 DIAGNOSIS — R7989 Other specified abnormal findings of blood chemistry: Secondary | ICD-10-CM | POA: Diagnosis not present

## 2023-07-10 DIAGNOSIS — Z1152 Encounter for screening for COVID-19: Secondary | ICD-10-CM | POA: Diagnosis not present

## 2023-07-10 DIAGNOSIS — J189 Pneumonia, unspecified organism: Secondary | ICD-10-CM | POA: Insufficient documentation

## 2023-07-10 DIAGNOSIS — R0602 Shortness of breath: Secondary | ICD-10-CM | POA: Diagnosis present

## 2023-07-10 LAB — D-DIMER, QUANTITATIVE: D-Dimer, Quant: 0.75 ug{FEU}/mL — ABNORMAL HIGH (ref 0.00–0.50)

## 2023-07-10 LAB — CBC
HCT: 37.7 % (ref 36.0–46.0)
Hemoglobin: 12.7 g/dL (ref 12.0–15.0)
MCH: 31.1 pg (ref 26.0–34.0)
MCHC: 33.7 g/dL (ref 30.0–36.0)
MCV: 92.2 fL (ref 80.0–100.0)
Platelets: 287 10*3/uL (ref 150–400)
RBC: 4.09 MIL/uL (ref 3.87–5.11)
RDW: 13.2 % (ref 11.5–15.5)
WBC: 7.7 10*3/uL (ref 4.0–10.5)
nRBC: 0 % (ref 0.0–0.2)

## 2023-07-10 LAB — BASIC METABOLIC PANEL
Anion gap: 11 (ref 5–15)
BUN: 15 mg/dL (ref 6–20)
CO2: 25 mmol/L (ref 22–32)
Calcium: 8.8 mg/dL — ABNORMAL LOW (ref 8.9–10.3)
Chloride: 102 mmol/L (ref 98–111)
Creatinine, Ser: 0.85 mg/dL (ref 0.44–1.00)
GFR, Estimated: >60 mL/min (ref >60)
Glucose, Bld: 115 mg/dL — ABNORMAL HIGH (ref 70–99)
Potassium: 3.9 mmol/L (ref 3.5–5.1)
Sodium: 138 mmol/L (ref 135–145)

## 2023-07-10 LAB — RESP PANEL BY RT-PCR (RSV, FLU A&B, COVID)  RVPGX2
Influenza A by PCR: NEGATIVE
Influenza B by PCR: NEGATIVE
Resp Syncytial Virus by PCR: NEGATIVE
SARS Coronavirus 2 by RT PCR: NEGATIVE

## 2023-07-10 LAB — TROPONIN I (HIGH SENSITIVITY): Troponin I (High Sensitivity): 4 ng/L (ref <18)

## 2023-07-10 LAB — PREGNANCY, URINE: Preg Test, Ur: NEGATIVE

## 2023-07-10 MED ORDER — AZITHROMYCIN 500 MG PO TABS
500.0000 mg | ORAL_TABLET | Freq: Every day | ORAL | 0 refills | Status: AC
Start: 2023-07-10 — End: 2023-07-13

## 2023-07-10 MED ORDER — IOHEXOL 350 MG/ML SOLN
100.0000 mL | Freq: Once | INTRAVENOUS | Status: AC | PRN
  Administered 2023-07-10: 75 mL via INTRAVENOUS

## 2023-07-10 NOTE — Discharge Instructions (Addendum)
I am prescribing a new medication called azithromycin.  You should take 1 pill/day for 3 days.  Continue your current prescription of amoxicillin until completed.  For your cough I recommend honey.  Encourage you to focus on taking deep breaths at home to open your lungs up.

## 2023-07-10 NOTE — ED Provider Notes (Signed)
[place] EMERGENCY DEPARTMENT AT [facility] Provider Note   CSN: [id number] Arrival date & time: 07/10/23  0044     History  Chief Complaint  Patient presents with   Cough    Denise Graves is a 22 y.o. female.  22 y.o. female presenting with 7 days of cough, fever, shortness of breath.  She was seen by urgent care on 10/15 and given a prescription for amoxicillin for community-acquired pneumonia seen on x-ray at the time.  She reports the antibiotics have not improved her cough and she continues to fever to an average of 101 at home with antipyretics on board.  She began having nausea and vomiting 48 hours ago that is intermittent.  She has been able to keep fluids down and has been urinating appropriately.  She has been otherwise healthy with no prior reactive airway disease or inhaler use.  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  She is up-to-date with her vaccinations.  The history is provided by the patient and a relative. No language interpreter was used.  Cough Associated symptoms: chills, fever, rhinorrhea and sore throat   Associated symptoms: no chest pain        Home Medications Prior to Admission medications   Medication Sig Start Date End Date Taking? Authorizing Provider  azithromycin (ZITHROMAX) 500 MG tablet Take 1 tablet (500 mg total) by mouth daily for 3 days. 07/10/23 07/13/23 Yes Glendale Chard, DO  cetirizine (ZYRTEC) 10 MG tablet Take 10 mg by mouth daily.    [provider]  LamoTRIgine 300 MG TB24 24 hour tablet Take 1 tablet twice a day 12/31/22   Van Clines, MD      Allergies    Other, Pumpkin seed oil, Mobic [meloxicam], Nutmeg oil (myristica oil), and Pistachio nut (diagnostic)    Review of Systems   Review of Systems  Constitutional:  Positive for activity change, chills, fatigue and fever.  HENT:  Positive for congestion, postnasal drip, rhinorrhea and sore throat.    Respiratory:  Positive for cough.   Cardiovascular:  Negative for chest pain.  Gastrointestinal:  Positive for nausea and vomiting. Negative for diarrhea.    Physical Exam Updated Vital Signs BP 132/64 (BP Location: Right Arm)   Pulse 95   Temp 100.2 F (37.9 C) (Oral)   Resp 20   Ht 6' (1.829 m)   Wt 81.6 kg   LMP 07/03/2023 (Exact Date)   SpO2 95%   BMI 24.41 kg/m  Physical Exam Constitutional:      General: She is not in acute distress.    Appearance: She is normal weight. She is ill-appearing.  HENT:     Right Ear: Tympanic membrane, ear canal and external ear normal.     Left Ear: Tympanic membrane, ear canal and external ear normal.     Nose: Congestion and rhinorrhea present.     Mouth/Throat:     Pharynx: Posterior oropharyngeal erythema present.  Eyes:     Conjunctiva/sclera: Conjunctivae normal.     Pupils: Pupils are equal, round, and reactive to light.  Cardiovascular:     Rate and Rhythm: Normal rate and regular rhythm.     Pulses: Normal pulses.     Heart sounds: Normal heart sounds. No murmur heard. Pulmonary:     Effort: Pulmonary effort is normal. No respiratory distress.     Breath sounds: Normal breath sounds.  Abdominal:     General: Abdomen is  flat. Bowel sounds are normal.     Palpations: Abdomen is soft.  Skin:    General: Skin is warm and dry.     Capillary Refill: Capillary refill takes less than 2 seconds.  Neurological:     General: No focal deficit present.     Mental Status: She is alert and oriented to person, place, and time. Mental status is at baseline.  Psychiatric:        Mood and Affect: Mood normal.        Behavior: Behavior normal.        Thought Content: Thought content normal.        Judgment: Judgment normal.     ED Results / Procedures / Treatments   Labs (all labs ordered are listed, but only abnormal results are displayed) Labs Reviewed  BASIC METABOLIC PANEL - Abnormal; Notable for the following components:       Result Value   Glucose, Bld 115 (*)    Calcium 8.8 (*)    All other components within normal limits  D-DIMER, QUANTITATIVE - Abnormal; Notable for the following components:   D-Dimer, Quant 0.75 (*)    All other components within normal limits  RESP PANEL BY RT-PCR (RSV, FLU A&B, COVID)  RVPGX2  CBC  PREGNANCY, URINE  TROPONIN I (HIGH SENSITIVITY)    EKG EKG Interpretation Date/Time:  Sunday July 10 2023 01:30:37 EDT Ventricular Rate:  97 PR Interval:  156 QRS Duration:  98 QT Interval:  348 QTC Calculation: 441 R Axis:   76  Text Interpretation: Normal sinus rhythm Right atrial enlargement RSR' or QR pattern in V1 suggests right ventricular conduction delay Borderline ECG No previous ECGs available No previous ECGs available Confirmed by Glynn Octave [phone]) on 07/10/2023 7:10:56 AM  Radiology CT Angio Chest PE W and/or Wo Contrast  Result Date: 07/10/2023 CLINICAL DATA:  22 year old female with history of cough and chest pain with fever for the past 5 days. Vomiting. EXAM: CT ANGIOGRAPHY CHEST WITH CONTRAST TECHNIQUE: Multidetector CT imaging of the chest was performed using the standard protocol during bolus administration of intravenous contrast. Multiplanar CT image reconstructions and MIPs were obtained to evaluate the vascular anatomy. RADIATION DOSE REDUCTION: This exam was performed according to the departmental dose-optimization program which includes automated exposure control, adjustment of the mA and/or kV according to patient size and/or use of iterative reconstruction technique. CONTRAST:  75mL OMNIPAQUE IOHEXOL 350 MG/ML SOLN COMPARISON:  No priors. FINDINGS: Cardiovascular: No filling defects within the pulmonary arterial tree to suggest pulmonary embolism. Heart size is normal. There is no significant pericardial fluid, thickening or pericardial calcification. No atherosclerotic calcifications are noted in the thoracic aorta or the coronary arteries.  Mediastinum/Nodes: No pathologically enlarged mediastinal or hilar lymph nodes. Small amount of soft tissue in the anterior mediastinum has imaging characteristics compatible with residual thymic tissue (within normal limits given the patient's young age). Esophagus is unremarkable in appearance. No axillary lymphadenopathy. Lungs/Pleura: Severe multifocal peribronchovascular airspace consolidation scattered throughout the lungs bilaterally, indicative of severe bronchopneumonia. Consolidation is most confluent in the inferior segment of the lingula and medial aspect of the left lower lobe. Trace left pleural effusion. Upper Abdomen: Unremarkable. Musculoskeletal: There are no aggressive appearing lytic or blastic lesions noted in the visualized portions of the skeleton. Review of the MIP images confirms the above findings. IMPRESSION: 1. No evidence of pulmonary embolism. 2. Severe multilobar bilateral bronchopneumonia with trace left parapneumonic pleural effusion. Electronically Signed   By: Trudie Reed  M.D.   On: 07/10/2023 06:55   DG Chest 2 View  Result Date: 07/10/2023 CLINICAL DATA:  Chest pain and cough.  Recent pneumonia treatment EXAM: CHEST - 2 VIEW COMPARISON:  None Available. FINDINGS: Bilateral patchy airspace disease in the lower lungs. No edema, effusion, or pneumothorax. Normal heart size and mediastinal contours. IMPRESSION: Multifocal pneumonia. Electronically Signed   By: Tiburcio Pea M.D.   On: 07/10/2023 04:38    Procedures Procedures    Medications Ordered in ED Medications  iohexol (OMNIPAQUE) 350 MG/ML injection 100 mL (75 mLs Intravenous Contrast Given 07/10/23 0523)    ED Course/ Medical Decision Making/ A&P                                 Medical Decision Making 22 y.o. female presenting with worsening cough and persistent fever.  On arrival vital signs significant for temperature to 100.2, oxygen 95% on room air.  On exam she is ill-appearing but in no acute  distress with diminished breath sounds bilaterally and productive cough.  She currently has 3 days left of amoxicillin which has not improved her symptoms.  Viral panel collected and negative for COVID and flu.  Chest x-ray obtained showing multifocal pneumonia.  BMP showing normal electrolytes and kidney function, CBC without leukocytosis.  D-dimer collected and elevated to 0.75.  CT PE obtained to rule out PE which was negative showing severe bilateral bronchial pneumonia with small trace pleural effusion on the left.  Most likely this is an atypical pneumonia not currently being covered by amoxicillin.  She is currently stable on room air showing no signs of respiratory distress.  Curb-65 score of 0, patient is appropriate for outpatient treatment. Will order course of azithromycin to cover for atypical pneumonia.  Patient to follow-up with outpatient PCP as needed or for worsening symptoms.  Instructed patient that she should remain out of school until she is afebrile for 24 hours off antipyretics.  Provided note for school with restrictions and time to return.  Amount and/or Complexity of Data Reviewed Labs: ordered. Decision-making details documented in ED Course. Radiology: ordered. Decision-making details documented in ED Course.  Risk Prescription drug management.         Final Clinical Impression(s) / ED Diagnoses Final diagnoses:  Atypical pneumonia    Rx / DC Orders ED Discharge Orders          Ordered    azithromycin (ZITHROMAX) 500 MG tablet  Daily        07/10/23 0752              Glendale Chard, DO 07/10/23 1610    Alvira Monday, MD 07/11/23 2233

## 2023-07-10 NOTE — ED Triage Notes (Signed)
Pt reports that she has had cough, chest pain, and fever x 5 days. Pt also endorses vomiting x 1 tonight. Pt was seen at [facility] and diagnosed with pneumonia and prescribed  Amoxicillin that she has been taking x 5 days. Pt states that she thinks that the coughing has worsened and she vomited once tonight and fever was 102.5 at home. She took Ibuprofen at 2300.

## 2023-08-05 ENCOUNTER — Encounter: Payer: Self-pay | Admitting: Neurology

## 2023-08-05 ENCOUNTER — Ambulatory Visit: Payer: 59 | Admitting: Neurology

## 2023-08-05 DIAGNOSIS — Z029 Encounter for administrative examinations, unspecified: Secondary | ICD-10-CM

## 2023-10-06 ENCOUNTER — Ambulatory Visit (INDEPENDENT_AMBULATORY_CARE_PROVIDER_SITE_OTHER): Payer: 59 | Admitting: Neurology

## 2023-10-06 ENCOUNTER — Encounter: Payer: Self-pay | Admitting: Neurology

## 2023-10-06 VITALS — BP 114/76 | HR 68 | Ht 72.0 in | Wt 187.0 lb

## 2023-10-06 DIAGNOSIS — G40209 Localization-related (focal) (partial) symptomatic epilepsy and epileptic syndromes with complex partial seizures, not intractable, without status epilepticus: Secondary | ICD-10-CM | POA: Diagnosis not present

## 2023-10-06 MED ORDER — LAMOTRIGINE ER 300 MG PO TB24: ORAL_TABLET | ORAL | 3 refills | Status: DC

## 2023-10-06 NOTE — Patient Instructions (Addendum)
Good to see you.  Continue Lamotrigine ER 300mg  twice a day  2. Continue to keep a calendar of your symptoms  3. Follow-up in 6 months, call for any changes   Seizure Precautions: 1. If medication has been prescribed for you to prevent seizures, take it exactly as directed.  Do not stop taking the medicine without talking to your doctor first, even if you have not had a seizure in a long time.   2. Avoid activities in which a seizure would cause danger to yourself or to others.  Don't operate dangerous machinery, swim alone, or climb in high or dangerous places, such as on ladders, roofs, or girders.  Do not drive unless your doctor says you may.  3. If you have any warning that you may have a seizure, lay down in a safe place where you can't hurt yourself.    4.  No driving for 6 months from last seizure, as per [facility].   Please refer to the following link on the Epilepsy Foundation of America's website for more information: http://www.epilepsyfoundation.org/answerplace/Social/driving/drivingu.cfm   5.  Maintain good sleep hygiene.Avoid alcohol  6.  Notify your neurology if you are planning pregnancy or if you become pregnant.  7.  Contact your doctor if you have any problems that may be related to the medicine you are taking.  8.  Call 911 and bring the patient back to the ED if:        A.  The seizure lasts longer than 5 minutes.       B.  The patient doesn't awaken shortly after the seizure  C.  The patient has new problems such as difficulty seeing, speaking or moving  D.  The patient was injured during the seizure  E.  The patient has a temperature over 102 F (39C)  F.  The patient vomited and now is having trouble breathing

## 2023-10-06 NOTE — Progress Notes (Signed)
NEUROLOGY FOLLOW UP OFFICE NOTE  Denise Graves [id number] 08-13-2001  HISTORY OF PRESENT ILLNESS: I had the pleasure of seeing Denise Graves in follow-up in the neurology clinic on 10/06/2023.  The patient was last seen 9 months ago for epilepsy.  She is again accompanied by her mother who helps supplement the history today.  Records and images were personally reviewed where available.  Since her last visit, no seizures with loss of awareness since 2019, however she has had a few of the very transient episodes of feeling funny/clammy. In the past 9 months, she had 2. One was on 7/14 after an exhausting morning rowing, she was sitting on the couch and her mother heard a chewing sound "for a blip," she was completely coherent. It lasted for just 10 seconds. She had woken up that morning with a headache and hands feeling clammy. This occurred at the end of an exhausting training week, she had been very stressed out. The second episode was 12/13, she was home for the holidays and slept in. She ate and was getting dressed standing at the sink when she told her mother her throat did not feel right and hands were clammy for 8 seconds. No loss of awareness/unresponsiveness. She started her period 2 days later. It was also during a period where she was exhausted. She denies any gaps in time, olfactory/gustatory hallucinations, focal numbness/tingling/weakness, myoclonic jerks. No headaches, dizziness, vision changes, no falls. She has some difficulty with sleep initiation around 4 times a week, but sleeps well once she gets to sleep. She shows bloodwork done at her PCP office, vitamin D level was very low at 21.1. Her thyroglobulin Ab was elevated, TSH was normal. She is in her last semester of school. She will be competing in [place] in the summer.   History on Initial Assessment 06/11/2021: This is a pleasant 23 year old right-handed woman with a history of focal epilepsy presenting to establish adult epilepsy  care. Records from her pediatric neurologist Dr. Sharene Skeans were reviewed. Seizures started about 10 days before her first birthday in the setting of fever. Almost a year later she had another seizure. EEG and brain MRI in 2003 were normal. She was started on Depakene. She was seizure-free for 2 years, repeat EEG was normal, and medication was tapered and discontinued in 2006. After 3 months, she had another seizure and Depakote was restarted. She had an EEG in 08/2008 with 2-3 second parieto-occipital bursts of 2 Hz sharply contoured slow wave activity. EEG in 07/2009 showed the same pattern. Repeat EEG in 07/2010 showed frequent runs of 4 Hz occipitally predominant spike and slow wave discharge, also seen on EEG in 08/2011. She again became seizure-free and had a normal EEG in 2015, Depakote was weaned off.  In the spring of 2018, she had an onset of episodes of feeling of warmth, dull headache, and deja vu. EEG in 06/2017 was normal. She had a 72-hour EEG with 2 episodes during sleep, one lasting 26 seconds, another lasting 10 seconds, with centrally predominant rhythmic delta-range activity in the first and sharply contoured slow-wave activity without clear field in the other. There were no clinical changes seen. Brain MRI in 08/2017 was normal. She had an increase in frequency of episodes with similar feeling of warmth in her lips, mouth, jaw, bad taste in her mouth, feeling hot and sweaty, with heart racing. Her mother witnessed her trying to speak as if her mouth were full or slightly chewing on her words. She  would be completely alert during them, and following an episode she would have a bowel movement within minutes and a slight headache. In 11/2017, they were on vacation in [place] when she had another similar episode that progressed to mastication, hands were clenched, eyes open but unable to communicate or respond for the first time. Her mother recalls that she woke up and said she was not feeling good,  went to the bathroom but did not come out. They found her on the commode with repetitive movements in both hands, eyes glazed, with incoherent speech. She was diaphoretic and flushed in her chest. This lasted for a couple of minutes, she was confused and tired after. She had another similar seizure that day. She was started on Lamotrigine ER, dose increased to 200mg  BID with no further similar symptoms since 11/2017. She denies any focal numbness/tingling/weakness, myoclonic jerks. No headaches, dizziness, diplopia, dysarthria/dysphagia, neck/back pain, bowel/bladder dysfunction. No side effects on Lamotrigine. She usually gets 6-7 hours of restful sleep. Mood is good. 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 Memory is okay. She lives with 2 roommates. She is driving.   Epilepsy Risk Factors:  A paternal second cousin has epilepsy. Her father had a febrile seizure. She had febrile seizures in childhood. Otherwise she had a normal birth and early development.  There is no history of CNS infections such as meningitis/encephalitis, significant traumatic brain injury, neurosurgical procedures.  Prior AEDs: Depakote  Laboratory Data: Last [name] level 9.1 in 10/2022 (300mg  BID)   PAST MEDICAL HISTORY: Past Medical History:  Diagnosis Date   Seizures (HCC)     MEDICATIONS: Current Outpatient Medications on File Prior to Visit  Medication Sig Dispense Refill   cetirizine (ZYRTEC) 10 MG tablet Take 10 mg by mouth daily.     LamoTRIgine 300 MG TB24 24 hour tablet Take 1 tablet twice a day 180 tablet 3   No current facility-administered medications on file prior to visit.    ALLERGIES: Allergies  Allergen Reactions   Other Rash    Crab, Atlantic White Fish   Pumpkin Seed Oil Hives and Nausea And Vomiting   Mobic [Meloxicam] Other (See Comments)    Per pt's mom-mom highly allergic and would prefer her not to ever take    Nutmeg Oil (Myristica Oil) Hives   Pistachio Nut (Diagnostic) Itching and Rash    FAMILY HISTORY: Family History  Problem Relation Age of Onset   Seizures Father        Febrile Sz   Colon cancer Maternal Grandmother        Died at age 57   Seizures Cousin        Maternal 2nd Cousin   Birth defects Cousin        2nd Cousin w an ill defined birth defect   Heart attack Maternal Grandfather    Lung cancer Paternal Grandmother     SOCIAL HISTORY: Social History   Socioeconomic History   Marital status: Single    Spouse name: Not on file   Number of children: Not on file   Years of education: Not on file   Highest education level: Not on file  Occupational History   Not on file  Tobacco Use   Smoking status: Never   Smokeless tobacco: Never  Vaping Use   Vaping status: Never Used  Substance and Sexual Activity   Alcohol use: No   Drug use: No  Sexual activity: Never    Comment: VIRGIN  Other Topics Concern   Not on file  Social History Narrative   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   She lives with both parents. She has no siblings.   She enjoys rowing, crafting and reading books.   She attends [employer].   Right handed    Social Drivers of Corporate investment banker Strain: Not on file  Food Insecurity: Not on file  Transportation Needs: Not on file  Physical Activity: Not on file  Stress: Not on file  Social Connections: Unknown (02/02/2022)   Received from [facility]   Social Network    Social Network: Not on file  Intimate Partner Violence: Unknown (12/21/2021)   Received from [facility]   HITS    Physically Hurt: Not on file    Insult or Talk Down To: Not on file    Threaten Physical Harm: Not on file    Scream or Curse: Not on file     PHYSICAL EXAM: Vitals:   10/06/23 1007  BP: 114/76  Pulse: 68  SpO2: 97%   General: No acute distress Head:   Normocephalic/atraumatic Skin/Extremities: No rash, no edema Neurological Exam: alert and awake. No aphasia or dysarthria. Fund of knowledge is appropriate. Attention and concentration are normal.   Cranial nerves: Pupils equal, round. Extraocular movements intact with no nystagmus. Visual fields full.  No facial asymmetry.  Motor: Bulk and tone normal, muscle strength 5/5 throughout with no pronator drift.   Finger to nose testing intact.  Gait narrow-based and steady, able to tandem walk adequately.  Romberg negative.   IMPRESSION: This is a pleasant 23 yo RH woman with a history of seizures since childhood, prior EEGs suggestive of a generalized epilepsy, seizure-free off medication for 3 years until 2018 when she had recurrent episodes of deja vu, feeling of warmth, with oral and hand automatisms suggestive of focal epilepsy. EEG was non-lateralizing. No seizures with loss of awareness since 2019. She has had 2 very brief episodes of feeling clammy/funny, during periods of exhaustion/stress. We discussed there is some wiggle room for increasing Lamotrigine dose, she would like to stay on Lamotrigine ER 300mg  BID and continue to monitor symptoms and work on symptom triggers. We discussed sleep hygiene. She is aware of [place] driving laws to stop driving after a seizure until 6 months seizure-free. Follow-up in 6 months, call for any changes.   Thank you for allowing me to participate in her care.  Please do not hesitate to call for any questions or concerns.    Patrcia Dolly, M.D.

## 2023-10-12 ENCOUNTER — Ambulatory Visit (HOSPITAL_BASED_OUTPATIENT_CLINIC_OR_DEPARTMENT_OTHER)
Admission: RE | Admit: 2023-10-12 | Discharge: 2023-10-12 | Disposition: A | Discharge status: Home / Self Care | Payer: 59 | Source: Ambulatory Visit | Attending: Family Medicine | Admitting: Family Medicine

## 2023-10-12 ENCOUNTER — Other Ambulatory Visit (HOSPITAL_BASED_OUTPATIENT_CLINIC_OR_DEPARTMENT_OTHER): Payer: Self-pay | Admitting: Family Medicine

## 2023-10-12 DIAGNOSIS — R059 Cough, unspecified: Secondary | ICD-10-CM | POA: Diagnosis present

## 2023-11-08 ENCOUNTER — Telehealth: Payer: Self-pay

## 2023-11-08 NOTE — Telephone Encounter (Signed)
Per patient mother a PA is needed for Lamotrigine 300 BID.   PA team please start a PA.

## 2023-11-11 ENCOUNTER — Telehealth: Payer: Self-pay | Admitting: Neurology

## 2023-11-11 ENCOUNTER — Telehealth: Payer: Self-pay | Admitting: Pharmacy Technician

## 2023-11-11 ENCOUNTER — Other Ambulatory Visit (HOSPITAL_COMMUNITY): Payer: Self-pay

## 2023-11-11 ENCOUNTER — Encounter: Payer: Self-pay | Admitting: Neurology

## 2023-11-11 DIAGNOSIS — R569 Unspecified convulsions: Secondary | ICD-10-CM

## 2023-11-11 DIAGNOSIS — G40209 Localization-related (focal) (partial) symptomatic epilepsy and epileptic syndromes with complex partial seizures, not intractable, without status epilepticus: Secondary | ICD-10-CM

## 2023-11-11 MED ORDER — LAMOTRIGINE ER 50 MG PO TB24: ORAL_TABLET | ORAL | 5 refills | Status: DC

## 2023-11-11 NOTE — Addendum Note (Signed)
Addended by: Dimas Chyle on: 11/11/2023 01:02 PM   Modules accepted: Orders

## 2023-11-11 NOTE — Telephone Encounter (Signed)
Pharmacy Patient Advocate Encounter   Received notification from Pt Calls Messages that prior authorization for lamoTRIgine ER 300MG  er tablets is required/requested.   Insurance verification completed.   The patient is insured through [facility] .   Per test claim: PA required; PA submitted to above mentioned insurance via CoverMyMeds Key/confirmation #/EOC [id number] Status is pending

## 2023-11-11 NOTE — Telephone Encounter (Signed)
Pharmacy Patient Advocate Encounter  Received notification from [facility] that Prior Authorization for lamoTRIgine ER 300MG  er tablets  has been APPROVED from 11/11/2023 to 11/10/2024. Ran test claim, Copay is $119.29. This test claim was processed through [facility]- copay amounts may vary at other pharmacies due to pharmacy/plan contracts, or as the patient moves through the different stages of their insurance plan.   PA #/Case ID/Reference #: [id number]

## 2023-11-11 NOTE — Telephone Encounter (Signed)
PA request has been Submitted. New Encounter created for follow up. For additional info see Pharmacy Prior Auth telephone encounter from 11/11/2023.

## 2023-11-11 NOTE — Telephone Encounter (Signed)
Pt mother sent a mychart to [facility] about Symphanie. She wants a call back from [place] today if possible. She needs sarahs med increased due to her having similar issues. Karel Jarvis is aware of this. Myley is leaving today. She states this is very urgent and needs resolved today

## 2023-11-11 NOTE — Telephone Encounter (Signed)
Spoke to mother, since last visit she has had 2 very brief episodes similar to prior, tiny blip of a moment, flushed and red, mouth chewing a little, but totally coherent. She had one after getting home from practice, she was tired. She had a good sleep, then this morning while laying on couch she had the same episode. We had previously discussed increasing Lamotrigine for continued episodes, increase to 350mg  BID. Rx sent for Lamotrigine ER 50mg : take 1 tab BID, take with 300mg  tab for total of 350mg  BID. Ota is going on a trip with her team tomorrow and still does not have her Lamotrigine 300mg , mother had called insurance who stated they don't have the prior authorization from our office. PA team was informed on 2/18, will f/u.   Check [name] level when she returns first week of March.

## 2023-11-18 ENCOUNTER — Telehealth: Payer: Self-pay

## 2023-11-18 NOTE — Telephone Encounter (Signed)
 Pt needs a PA for lamotrigine ER 50mg 

## 2023-11-23 ENCOUNTER — Other Ambulatory Visit (HOSPITAL_COMMUNITY): Payer: Self-pay

## 2023-11-23 ENCOUNTER — Encounter: Payer: Self-pay | Admitting: Podiatry

## 2023-11-23 ENCOUNTER — Telehealth: Payer: Self-pay

## 2023-11-23 NOTE — Telephone Encounter (Signed)
 PA request has been Cancelled. New Encounter has been or will be created for follow up. For additional info see Pharmacy Prior Auth telephone encounter from 03/05.  PA not needed

## 2023-11-23 NOTE — Telephone Encounter (Signed)
*  [facility]  Pharmacy Patient Advocate Encounter   Received notification from Pt Calls Messages that prior authorization for lamoTRIgine ER 50MG  er tablets  is required/requested.   Insurance verification completed.   The patient is insured through [facility] .   Per test claim: The current 30 day co-pay is, $56.00.  No PA needed at this time. This test claim was processed through [facility]- copay amounts may vary at other pharmacies due to pharmacy/plan contracts, or as the patient moves through the different stages of their insurance plan.

## 2023-11-24 NOTE — Telephone Encounter (Signed)
 Called patient scheduled her for 10am on Monday with Dr Al Corpus.

## 2023-11-25 ENCOUNTER — Other Ambulatory Visit (HOSPITAL_COMMUNITY): Payer: Self-pay

## 2023-11-28 ENCOUNTER — Ambulatory Visit: Admitting: Podiatry

## 2023-11-28 ENCOUNTER — Encounter: Payer: Self-pay | Admitting: Podiatry

## 2023-11-28 ENCOUNTER — Ambulatory Visit (INDEPENDENT_AMBULATORY_CARE_PROVIDER_SITE_OTHER)

## 2023-11-28 DIAGNOSIS — S93492A Sprain of other ligament of left ankle, initial encounter: Secondary | ICD-10-CM | POA: Diagnosis not present

## 2023-11-28 DIAGNOSIS — M722 Plantar fascial fibromatosis: Secondary | ICD-10-CM | POA: Diagnosis not present

## 2023-11-28 DIAGNOSIS — S93499A Sprain of other ligament of unspecified ankle, initial encounter: Secondary | ICD-10-CM

## 2023-11-28 MED ORDER — METHYLPREDNISOLONE 4 MG PO TBPK: ORAL_TABLET | ORAL | 0 refills | Status: DC

## 2023-11-28 NOTE — Progress Notes (Signed)
 She presents today with chief complaint of lateral ankle and leg pain.  She fell and rolled her ankle while running for warm-ups for rowing.  This occurred on 8 February.  She went to [facility] on Sunday was x-rayed and was told that she had a high ankle sprain.  She wore boot for 8 to 10 days and then was switched to a smaller brace.  She saw her PCP who told her that she needed to consider an MRI.  She states that though it is better the symptoms are still lingering and wanted to have it checked.  Objective: Vital signs are stable she is alert oriented x 3.  Pulses are palpable.  She does have pain on palpation to the anterior ankle and the extensor tendons of the ankle.  But they all appear to be intact there is no ecchymosis and no swelling.  She also has tenderness on palpation of the peroneal tendons from just below the lateral malleolus extending up into the muscle belly of the lateral compartment.  She has no pain on range of motion of the toes or the foot in general only against resistance.  Radiographs taken today do not demonstrate any type of osseous abnormalities other than a mature individual with good bone mineralization.  I see no signs of acute injury.  Assessment capsulitis of the ankle joint left extensor tendinitis.  Anterior talofibular ligament sprain calcaneofibular ligament split sprain and probably a sprain or strain of her inferior interosseous ligament.  Plan: Discussed etiology pathology conservative versus surgical therapies.  Highly recommended that she continue to wear the brace.  Started her on methylprednisolone encouraged compression ice elevation no running no weight lifting only rowing.

## 2023-12-08 ENCOUNTER — Encounter: Payer: Self-pay | Admitting: Podiatry

## 2023-12-12 ENCOUNTER — Ambulatory Visit: Admitting: Podiatry

## 2023-12-27 ENCOUNTER — Encounter: Payer: Self-pay | Admitting: Neurology

## 2023-12-27 MED ORDER — LAMOTRIGINE ER 50 MG PO TB24: ORAL_TABLET | ORAL | 3 refills | Status: DC

## 2024-03-15 ENCOUNTER — Telehealth: Payer: Self-pay | Admitting: Podiatry

## 2024-03-15 NOTE — Telephone Encounter (Signed)
 Patient mom is requesting EPAT is it okay to schedule?

## 2024-03-16 NOTE — Telephone Encounter (Signed)
 EPAT Sch'ed 7/3 @ 8:30a

## 2024-03-22 ENCOUNTER — Ambulatory Visit (INDEPENDENT_AMBULATORY_CARE_PROVIDER_SITE_OTHER)

## 2024-03-22 DIAGNOSIS — M722 Plantar fascial fibromatosis: Secondary | ICD-10-CM

## 2024-03-22 NOTE — Patient Instructions (Signed)

## 2024-03-22 NOTE — Progress Notes (Signed)
 Patient presents for the 1st EPAT treatment today with complaint of left heel pain and arch pain. Diagnosed with plantar fascitis by Dr. Verta. This has been ongoing for several months. The patient has tried ice, stretching, NSAIDS and supportive shoe gear with no long term relief.   Most of the pain is located in the left arch and heel .  ESWT administered and tolerated well.Treatment settings initiated at:   Energy: 0.12  Ended treatment session today with 3000 shocks at the following settings:   Energy: 0.12  Frequency: 6.0  4 passes were administered to left calf with massaging device as well.    Reviewed post EPAT instructions. Advised to avoid ice and NSAIDs throughout the treatment process and to utilize boot or supportive shoes for at least the next 3 days.  Follow up for 2nd treatment in approximately 1 week.   Mother was in room with patient during treatment.

## 2024-03-25 ENCOUNTER — Encounter: Payer: Self-pay | Admitting: Neurology

## 2024-03-30 ENCOUNTER — Encounter: Payer: Self-pay | Admitting: Neurology

## 2024-03-30 ENCOUNTER — Other Ambulatory Visit

## 2024-03-30 ENCOUNTER — Ambulatory Visit: Payer: 59 | Admitting: Neurology

## 2024-03-30 VITALS — BP 101/65 | HR 95 | Ht 72.0 in | Wt 190.0 lb

## 2024-03-30 DIAGNOSIS — G40209 Localization-related (focal) (partial) symptomatic epilepsy and epileptic syndromes with complex partial seizures, not intractable, without status epilepticus: Secondary | ICD-10-CM

## 2024-03-30 MED ORDER — LAMOTRIGINE ER 50 MG PO TB24: ORAL_TABLET | ORAL | 3 refills | Status: DC

## 2024-03-30 MED ORDER — LAMOTRIGINE ER 300 MG PO TB24: ORAL_TABLET | ORAL | 3 refills | Status: DC

## 2024-03-30 NOTE — Progress Notes (Signed)
 NEUROLOGY FOLLOW UP OFFICE NOTE  Denise Graves [id number] 10-Feb-2001  HISTORY OF PRESENT ILLNESS: I had the pleasure of seeing Denise Graves in follow-up in the neurology clinic on 03/30/2024.  The patient was last seen 6 months ago for epilepsy. She is again accompanied by her mother Denise Graves who helps supplement the history today.  Records and images were personally reviewed where available.  Since her last visit, her mother contacted our office about a couple more instances, on 2/20 and 2/21. I spoke to her on the phone, she reported very brief tiny blip of a moment where [name] was flushed and red, mouth chewing a little, totally coherent. Lamotrigine  increased to 350mg  BID. She denies any similar episodes since then. She denies any staring/unresponsive episodes, gaps in time, olfactory/gustatory hallucinations, focal numbness/tingling/weakness, myoclonic jerks. No headaches, dizziness, vision changes, no falls. No side effects on increased dose. She gets 8.5 hours of sleep. Mood is good. She feels medication has affected her weight, however on review of weight on prior visits, no significant changes seen (Jan 2024: 185lbs, April 2024: 189lbs, Jan 2025 187lbs, today 190lbs). She recently graduated and is looking for jobs. 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    History on Initial Assessment 06/11/2021: This is a pleasant 23 year old right-handed woman with a history of focal epilepsy presenting to establish adult epilepsy care. Records from her pediatric neurologist Dr. Susen were reviewed. Seizures started about 10 days before her first birthday in the setting of fever. Almost a year later she had another seizure. EEG and brain MRI in 2003 were normal. She was started on Depakene . She was seizure-free for 2 years, repeat EEG was normal, and medication was tapered and discontinued in 2006. After 3 months, she had another seizure and Depakote  was restarted. She had an EEG in 08/2008  with 2-3 second parieto-occipital bursts of 2 Hz sharply contoured slow wave activity. EEG in 07/2009 showed the same pattern. Repeat EEG in 07/2010 showed frequent runs of 4 Hz occipitally predominant spike and slow wave discharge, also seen on EEG in 08/2011. She again became seizure-free and had a normal EEG in 2015, Depakote  was weaned off.  In the spring of 2018, she had an onset of episodes of feeling of warmth, dull headache, and deja vu. EEG in 06/2017 was normal. She had a 72-hour EEG with 2 episodes during sleep, one lasting 26 seconds, another lasting 10 seconds, with centrally predominant rhythmic delta-range activity in the first and sharply contoured slow-wave activity without clear field in the other. There were no clinical changes seen. Brain MRI in 08/2017 was normal. She had an increase in frequency of episodes with similar feeling of warmth in her lips, mouth, jaw, bad taste in her mouth, feeling hot and sweaty, with heart racing. Her mother witnessed her trying to speak as if her mouth were full or slightly chewing on her words. She would be completely alert during them, and following an episode she would have a bowel movement within minutes and a slight headache. In 11/2017, they were on vacation in [place]  when she had another similar episode that progressed to mastication, hands were clenched, eyes open but unable to communicate or respond for the first time. Her mother recalls that she woke up and said she was not feeling good, went to the bathroom but did not come out. They found her on the commode with repetitive movements in both hands, eyes glazed, with incoherent speech. She  was diaphoretic and flushed in her chest. This lasted for a couple of minutes, she was confused and tired after. She had another similar seizure that day. She was started on Lamotrigine  ER, dose increased to 200mg  BID with no further similar symptoms since 11/2017. She denies any focal numbness/tingling/weakness,  myoclonic jerks. No headaches, dizziness, diplopia, dysarthria/dysphagia, neck/back pain, bowel/bladder dysfunction. No side effects on Lamotrigine . She usually gets 6-7 hours of restful sleep. Mood is good. 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 Memory is okay. She lives with 2 roommates. She is driving.   Epilepsy Risk Factors:  A paternal second cousin has epilepsy. Her father had a febrile seizure. She had febrile seizures in childhood. Otherwise she had a normal birth and early development.  There is no history of CNS infections such as meningitis/encephalitis, significant traumatic brain injury, neurosurgical procedures.  Prior AEDs: Depakote   Laboratory Data: Last [name]  level 9.1 in 10/2022 (300mg  BID)  PAST MEDICAL HISTORY: Past Medical History:  Diagnosis Date   Seizures (HCC)     MEDICATIONS: Current Outpatient Medications on File Prior to Visit  Medication Sig Dispense Refill   lamoTRIgine  ([name]  XR) 50 MG 24 hour tablet Take 1 tablet twice a day (take with 300mg  twice a day for a total of 350mg  twice a day) 180 tablet 3   LamoTRIgine  300 MG TB24 24 hour tablet Take 1 tablet twice a day 180 tablet 3   methylPREDNISolone  (MEDROL  DOSEPAK) 4 MG TBPK tablet 6 day dose pack - take as directed 21 tablet 0   No current facility-administered medications on file prior to visit.    ALLERGIES: Allergies  Allergen Reactions   Other Rash    Crab, Atlantic White Fish   Pumpkin Seed Oil Hives and Nausea And Vomiting   Mobic [Meloxicam] Other (See Comments)    Per pt's mom-mom highly allergic and would prefer her not to ever take   Nutmeg Oil (Myristica Oil) Hives   Pistachio Nut (Diagnostic) Itching and Rash    FAMILY HISTORY: Family History  Problem Relation Age of Onset   Seizures Father        Febrile Sz   Colon cancer Maternal Grandmother        Died at age 1   Seizures Cousin        Maternal  2nd Cousin   Birth defects Cousin        2nd Cousin w an ill defined birth defect   Heart attack Maternal Grandfather    Lung cancer Paternal Grandmother     SOCIAL HISTORY: Social History   Socioeconomic History   Marital status: Single    Spouse name: Not on file   Number of children: Not on file   Years of education: Not on file   Highest education level: Not on file  Occupational History   Not on file  Tobacco Use   Smoking status: Never   Smokeless tobacco: Never  Vaping Use   Vaping status: Never Used  Substance and Sexual Activity   Alcohol use: No   Drug use: No   Sexual activity: Never    Comment: VIRGIN  Other Topics Concern   Not on file  Social History Narrative   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   She lives with both parents. She has no siblings.   She enjoys rowing, crafting and reading books.  She attends [employer].   Right handed    Social Drivers of Health   Financial Resource Strain: Not on file  Food Insecurity: Not on file  Transportation Needs: Not on file  Physical Activity: Not on file  Stress: Not on file  Social Connections: Unknown (02/02/2022)   Received from [facility]   Social Network    Social Network: Not on file  Intimate Partner Violence: Unknown (12/21/2021)   Received from [facility]   HITS    Physically Hurt: Not on file    Insult or Talk Down To: Not on file    Threaten Physical Harm: Not on file    Scream or Curse: Not on file     PHYSICAL EXAM: Vitals:   03/30/24 0833  BP: 101/65  Pulse: 95   General: No acute distress Head:  Normocephalic/atraumatic Skin/Extremities: No rash, no edema Neurological Exam: alert and awake. No aphasia or dysarthria. Fund of knowledge is appropriate.  Attention and concentration are normal.   Cranial nerves: Pupils equal, round. Extraocular movements intact with no nystagmus. Visual fields full.  No facial asymmetry.   Motor: Bulk and tone normal, muscle strength 5/5 throughout with no pronator drift.   Finger to nose testing intact.  Gait narrow-based and steady, able to tandem walk adequately.  Romberg negative.   IMPRESSION: This is a pleasant 23 yo RH woman with a history of seizures since childhood, prior EEGs suggestive of a generalized epilepsy, seizure-free off medication for 3 years until 2018 when she had recurrent episodes of deja vu, feeling of warmth, with oral and hand automatisms suggestive of focal epilepsy. EEG was non-lateralizing. No seizures with loss of awareness since 2019, no very brief episodes of flushed feeling since 10/2023, now on Lamotrigine  350mg  BID with no recurrence. Check [name]  level on current dose. She is aware of [place] driving laws to stop driving after a seizure until 6 months seizure-free. Discuss weight concerns with PCP. Follow-up in 6 months, call for any changes.    Thank you for allowing me to participate in her care.  Please do not hesitate to call for any questions or concerns.    Darice Shivers, M.D.   CC: Dr. Hughie

## 2024-03-30 NOTE — Patient Instructions (Signed)
 Good to see you! Wishing you all the best.  Check [name]  level today  2. Continue Lamotrigine  350mg  twice a day  3. Follow-up in 6 months, call for any changes   Seizure Precautions: 1. If medication has been prescribed for you to prevent seizures, take it exactly as directed.  Do not stop taking the medicine without talking to your doctor first, even if you have not had a seizure in a long time.   2. Avoid activities in which a seizure would cause danger to yourself or to others.  Don't operate dangerous machinery, swim alone, or climb in high or dangerous places, such as on ladders, roofs, or girders.  Do not drive unless your doctor says you may.  3. If you have any warning that you may have a seizure, lay down in a safe place where you can't hurt yourself.    4.  No driving for 6 months from last seizure, as per [place]  state law.   Please refer to the following link on the Epilepsy Foundation of America's website for more information: http://www.epilepsyfoundation.org/answerplace/Social/driving/drivingu.cfm   5.  Maintain good sleep hygiene. Avoid alcohol.  6.  Notify your neurology if you are planning pregnancy or if you become pregnant.  7.  Contact your doctor if you have any problems that may be related to the medicine you are taking.  8.  Call 911 and bring the patient back to the ED if:        A.  The seizure lasts longer than 5 minutes.       B.  The patient doesn't awaken shortly after the seizure  C.  The patient has new problems such as difficulty seeing, speaking or moving  D.  The patient was injured during the seizure  E.  The patient has a temperature over 102 F (39C)  F.  The patient vomited and now is having trouble breathing

## 2024-04-02 LAB — LAMOTRIGINE LEVEL: Lamotrigine Lvl: 11.5 ug/mL (ref 2.5–15.0)

## 2024-04-03 ENCOUNTER — Ambulatory Visit

## 2024-04-03 DIAGNOSIS — M722 Plantar fascial fibromatosis: Secondary | ICD-10-CM

## 2024-04-03 NOTE — Progress Notes (Signed)
 Patient presents for the 2nd EPAT treatment today with complaint of left heel pain and arch pain. Diagnosed with plantar fascitis by Dr. Verta. This has been ongoing for several months. The patient has tried ice, stretching, NSAIDS and supportive shoe gear with no long term relief.    Most of the pain is located in the left arch and heel .   ESWT administered and tolerated well.Treatment settings initiated at:               Energy:           0.15   Ended treatment session today with 3000 shocks at the following settings:               Energy:           0.20             Frequency:     5.0   4 passes were administered to left calf with massaging device as well.      Reviewed post EPAT instructions. Advised to avoid ice and NSAIDs throughout the treatment process and to utilize boot or supportive shoes for at least the next 3 days.   Follow up for 3rd treatment in approximately 1 week.    Mother was in room with patient during treatment.

## 2024-04-10 ENCOUNTER — Other Ambulatory Visit

## 2024-04-13 ENCOUNTER — Ambulatory Visit (INDEPENDENT_AMBULATORY_CARE_PROVIDER_SITE_OTHER)

## 2024-04-13 DIAGNOSIS — M722 Plantar fascial fibromatosis: Secondary | ICD-10-CM

## 2024-04-13 NOTE — Progress Notes (Signed)
 Patient presents for the 3rd EPAT treatment today with complaint of left heel pain and arch pain. Diagnosed with plantar fascitis by Dr. Verta. This has been ongoing for several months. The patient has tried ice, stretching, NSAIDS and supportive shoe gear with no long term relief.    Most of the pain is located in the left arch.   ESWT administered and tolerated well.Treatment settings initiated at:               Energy:           0.30   Ended treatment session today with 3000 shocks at the following settings:               Energy:           0.40             Frequency:     3.0   4 passes were administered to left calf with massaging device as well.      Reviewed post EPAT instructions. Advised to avoid ice and NSAIDs throughout the treatment process and to utilize boot or supportive shoes for at least the next 3 days.   Follow up for 4th treatment in approximately 2 weeks.    Mother was in room with patient during treatment.

## 2024-04-20 ENCOUNTER — Ambulatory Visit: Payer: Self-pay | Admitting: Neurology

## 2024-04-24 ENCOUNTER — Other Ambulatory Visit

## 2024-04-27 ENCOUNTER — Ambulatory Visit (INDEPENDENT_AMBULATORY_CARE_PROVIDER_SITE_OTHER)

## 2024-04-27 DIAGNOSIS — M722 Plantar fascial fibromatosis: Secondary | ICD-10-CM

## 2024-04-27 NOTE — Progress Notes (Signed)
 Patient presents for the 4th EPAT treatment today with complaint of left heel pain and arch pain. Diagnosed with plantar fascitis by Dr. Verta. This has been ongoing for several months. The patient has tried ice, stretching, NSAIDS and supportive shoe gear with no long term relief.   Most of the pain is located left arch .  ESWT administered and tolerated well.Treatment settings initiated at:   Energy: 0.40  Ended treatment session today with 3000 shocks at the following settings:   Energy: 0.50  Frequency: 3.0  Patient reports some improvement but does have some residual throbbing in the area at times.    Reviewed post EPAT instructions. Advised to avoid ice and NSAIDs throughout the treatment process and to utilize boot or supportive shoes for at least the next 3 days.  Follow up with Dr. Verta in 2-3 weeks.

## 2024-10-01 ENCOUNTER — Encounter: Payer: Self-pay | Admitting: Neurology

## 2024-10-01 ENCOUNTER — Ambulatory Visit (INDEPENDENT_AMBULATORY_CARE_PROVIDER_SITE_OTHER): Admitting: Neurology

## 2024-10-01 VITALS — BP 120/77 | HR 74 | Ht 72.0 in | Wt 178.6 lb

## 2024-10-01 DIAGNOSIS — G40209 Localization-related (focal) (partial) symptomatic epilepsy and epileptic syndromes with complex partial seizures, not intractable, without status epilepticus: Secondary | ICD-10-CM | POA: Diagnosis not present

## 2024-10-01 MED ORDER — LAMOTRIGINE ER 50 MG PO TB24: ORAL_TABLET | ORAL | 4 refills | Status: AC

## 2024-10-01 MED ORDER — LAMOTRIGINE ER 300 MG PO TB24: ORAL_TABLET | ORAL | 4 refills | Status: AC

## 2024-10-01 NOTE — Progress Notes (Signed)
 "  NEUROLOGY FOLLOW UP OFFICE NOTE  Denise Graves [id number] 01/05/2001  Discussed the use of AI scribe software for clinical note transcription with the patient, who gave verbal consent to proceed.  History of Present Illness I had the pleasure of seeing Denise Graves in follow-up in the neurology clinic on 10/01/2024.  The patient was last seen 6 months ago for epilepsy. She is again accompanied by her mother who helps supplement the history today.  Records and images were personally reviewed where available.  She is currently on Lamotrigine  350mg  BID without side effects. Lamotrigine  level in 03/2024 was 11.5. Since her last visit, they report she has been doing well with no episodes since 10/2023 where she was flushed, mouth chewing a little, totally coherent. No seizures with loss of awareness since 2019. She denies any staring/unresponsive episodes, gaps in time, focal numbness/tingling/weakness, myoclonic jerks. No headaches, dizziness, vision changes, no falls. Approximately two to three weeks ago, she developed a small patch of shingles on her lower back, which was itchy and painful. She was treated with Valtrex for a week, which resolved the symptoms without any lasting pain. This was her first episode of shingles. Sleep is good now that she is back home. Mood is good. She remains active, rowing 3 times a week. She works as a engineer, technical sales in primary school teacher.     History on Initial Assessment 06/11/2021: This is a pleasant 24 year old right-handed woman with a history of focal epilepsy presenting to establish adult epilepsy care. Records from her pediatric neurologist Dr. Susen were reviewed. Seizures started about 10 days before her first birthday in the setting of fever. Almost a year later she had another seizure. EEG and brain MRI in 2003 were normal. She was started on Depakene . She was seizure-free for 2 years, repeat EEG was normal, and medication was tapered and discontinued in 2006. After 3  months, she had another seizure and Depakote  was restarted. She had an EEG in 08/2008 with 2-3 second parieto-occipital bursts of 2 Hz sharply contoured slow wave activity. EEG in 07/2009 showed the same pattern. Repeat EEG in 07/2010 showed frequent runs of 4 Hz occipitally predominant spike and slow wave discharge, also seen on EEG in 08/2011. She again became seizure-free and had a normal EEG in 2015, Depakote  was weaned off.  In the spring of 2018, she had an onset of episodes of feeling of warmth, dull headache, and deja vu. EEG in 06/2017 was normal. She had a 72-hour EEG with 2 episodes during sleep, one lasting 26 seconds, another lasting 10 seconds, with centrally predominant rhythmic delta-range activity in the first and sharply contoured slow-wave activity without clear field in the other. There were no clinical changes seen. Brain MRI in 08/2017 was normal. She had an increase in frequency of episodes with similar feeling of warmth in her lips, mouth, jaw, bad taste in her mouth, feeling hot and sweaty, with heart racing. Her mother witnessed her trying to speak as if her mouth were full or slightly chewing on her words. She would be completely alert during them, and following an episode she would have a bowel movement within minutes and a slight headache. In 11/2017, they were on vacation in [place]  when she had another similar episode that progressed to mastication, hands were clenched, eyes open but unable to communicate or respond for the first time. Her mother recalls that she woke up and said she was not feeling good, went to the bathroom but did not  come out. They found her on the commode with repetitive movements in both hands, eyes glazed, with incoherent speech. She was diaphoretic and flushed in her chest. This lasted for a couple of minutes, she was confused and tired after. She had another similar seizure that day. She was started on Lamotrigine  ER, dose increased to 200mg  BID with no  further similar symptoms since 11/2017. She denies any focal numbness/tingling/weakness, myoclonic jerks. No headaches, dizziness, diplopia, dysarthria/dysphagia, neck/back pain, bowel/bladder dysfunction. No side effects on Lamotrigine . She usually gets 6-7 hours of restful sleep. Mood is good. 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 Memory is okay. She lives with 2 roommates. She is driving.   Epilepsy Risk Factors:  A paternal second cousin has epilepsy. Her father had a febrile seizure. She had febrile seizures in childhood. Otherwise she had a normal birth and early development.  There is no history of CNS infections such as meningitis/encephalitis, significant traumatic brain injury, neurosurgical procedures.  Prior AEDs: Depakote   Laboratory Data: Last [name]  level 9.1 in 10/2022 (300mg  BID)   PAST MEDICAL HISTORY: Past Medical History:  Diagnosis Date   Seizures (HCC)     MEDICATIONS: Medications Ordered Prior to Encounter[1]  ALLERGIES: Allergies[2]  FAMILY HISTORY: Family History  Problem Relation Age of Onset   Seizures Father        Febrile Sz   Colon cancer Maternal Grandmother        Died at age 54   Seizures Cousin        Maternal 2nd Cousin   Birth defects Cousin        2nd Cousin w an ill defined birth defect   Heart attack Maternal Grandfather    Lung cancer Paternal Grandmother     SOCIAL HISTORY: Social History   Socioeconomic History   Marital status: Single    Spouse name: Not on file   Number of children: Not on file   Years of education: Not on file   Highest education level: Not on file  Occupational History   Not on file  Tobacco Use   Smoking status: Never   Smokeless tobacco: Never  Vaping Use   Vaping status: Never Used  Substance and Sexual Activity   Alcohol use: No   Drug use: No   Sexual activity: Never    Comment: [place]  Other Topics Concern   Not on  file  Social History Narrative   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   She lives with both parents. She has no siblings.   She enjoys rowing, crafting and reading books.   She attends [employer].   Right handed    Social Drivers of Health   Tobacco Use: Low Risk (03/30/2024)   Patient History    Smoking Tobacco Use: Never    Smokeless Tobacco Use: Never    Passive Exposure: Not on file  Financial Resource Strain: Not on file  Food Insecurity: Not on file  Transportation Needs: Not on file  Physical Activity: Not on file  Stress: Not on file  Social Connections: Not on file  Intimate Partner Violence: Not on file  Depression (EYV7-0): Not on file  Alcohol Screen: Not on file  Housing: Not on file  Utilities: Not on file  Health Literacy: Not on file     PHYSICAL EXAM: Vitals:   10/01/24 1141  BP: 120/77  Pulse: 74  SpO2: 98%   General: No acute distress Head:  Normocephalic/atraumatic Skin/Extremities: No rash, no edema Neurological Exam: alert and awake. No aphasia or dysarthria. Fund of knowledge is appropriate. Attention and concentration are normal.   Cranial nerves: Pupils equal, round. Extraocular movements intact with no nystagmus. Visual fields full.  No facial asymmetry.  Motor: Bulk and tone normal, muscle strength 5/5 throughout with no pronator drift. Reflexes brisk +2 throughout.  Finger to nose testing intact.  Gait narrow-based and steady, able to tandem walk adequately.  Romberg negative.   IMPRESSION: This is a pleasant 24 yo RH woman with a history of seizures since childhood, prior EEGs suggestive of a generalized epilepsy, seizure-free off medication for 3 years until 2018 when she had recurrent episodes of deja vu, feeling of warmth, with oral and hand automatisms suggestive of focal epilepsy. EEG was non-lateralizing. No seizures with loss of awareness since 2019, no episodes of flushed feeling  since 10/2023, continue Lamotrigine  ER 350mg  BID, refills sent. She is aware of [place] driving laws to stop driving after a seizure until 6 months seizure-free. Follow-up in 1 year, call for any changes.     Thank you for allowing me to participate in her care.  Please do not hesitate to call for any questions or concerns.    Darice Shivers, M.D.   CC: Dr. Hughie      [1]  Current Outpatient Medications on File Prior to Visit  Medication Sig Dispense Refill   AMNESTEEM 40 MG capsule Take 40 mg by mouth daily.     lamoTRIgine  ([name]  XR) 50 MG 24 hour tablet Take 1 tablet twice a day (take with 300mg  twice a day for a total of 350mg  twice a day) 180 tablet 3   LamoTRIgine  300 MG TB24 24 hour tablet Take 1 tablet twice a day (take with 50mg  twice a day for a total of 350mg  twice a day) 180 tablet 3   methylPREDNISolone  (MEDROL  DOSEPAK) 4 MG TBPK tablet 6 day dose pack - take as directed 21 tablet 0   No current facility-administered medications on file prior to visit.  [2]  Allergies Allergen Reactions   Other Rash    Crab, Atlantic White Fish   Pumpkin Seed Oil Hives and Nausea And Vomiting   Mobic [Meloxicam] Other (See Comments)    Per pt's mom-mom highly allergic and would prefer her not to ever take   Nutmeg Oil (Myristica Oil) Hives   Pistachio Nut (Diagnostic) Itching and Rash   "

## 2024-10-01 NOTE — Patient Instructions (Signed)
 Good to see you doing well! Continue Lamotrigine  ER 350mg  twice a day. Follow-up in 1 year, call for any changes.    Seizure Precautions: 1. If medication has been prescribed for you to prevent seizures, take it exactly as directed.  Do not stop taking the medicine without talking to your doctor first, even if you have not had a seizure in a long time.   2. Avoid activities in which a seizure would cause danger to yourself or to others.  Don't operate dangerous machinery, swim alone, or climb in high or dangerous places, such as on ladders, roofs, or girders.  Do not drive unless your doctor says you may.  3. If you have any warning that you may have a seizure, lay down in a safe place where you can't hurt yourself.    4.  No driving for 6 months from last seizure, as per [place]  state law.   Please refer to the following link on the [employer]ile[employer] of [employer]'s website for more information: http://www.epilepsyfoundation.org/answerplace/Social/driving/drivingu.cfm   5.  Maintain good sleep hygiene. Avoid alcohol.  6.  Notify your neurology if you are planning pregnancy or if you become pregnant.  7.  Contact your doctor if you have any problems that may be related to the medicine you are taking.  8.  Call 911 and bring the patient back to the ED if:        A.  The seizure lasts longer than 5 minutes.       B.  The patient doesn't awaken shortly after the seizure  C.  The patient has new problems such as difficulty seeing, speaking or moving  D.  The patient was injured during the seizure  E.  The patient has a temperature over 102 F (39C)  F.  The patient vomited and now is having trouble breathing
